# Patient Record
Sex: Male | Born: 1970 | Race: Black or African American | Hispanic: No | Marital: Married | Smoking: Never smoker
Health system: Southern US, Community
[De-identification: ages and names within clinical notes are randomized; demographics above are authoritative.]

## PROBLEM LIST (undated history)

## (undated) DIAGNOSIS — M25559 Pain in unspecified hip: Secondary | ICD-10-CM

## (undated) DIAGNOSIS — Z87442 Personal history of urinary calculi: Secondary | ICD-10-CM

## (undated) DIAGNOSIS — I10 Essential (primary) hypertension: Secondary | ICD-10-CM

## (undated) DIAGNOSIS — L409 Psoriasis, unspecified: Secondary | ICD-10-CM

## (undated) HISTORY — PX: NO PAST SURGERIES: SHX2092

## (undated) HISTORY — DX: Psoriasis, unspecified: L40.9

## (undated) HISTORY — DX: Essential (primary) hypertension: I10

## (undated) HISTORY — DX: Personal history of urinary calculi: Z87.442

---

## 2004-11-10 ENCOUNTER — Ambulatory Visit: Payer: Self-pay | Admitting: Family Medicine

## 2004-11-16 ENCOUNTER — Encounter: Admission: RE | Admit: 2004-11-16 | Discharge: 2004-12-17 | Payer: Self-pay | Admitting: Family Medicine

## 2004-12-29 ENCOUNTER — Ambulatory Visit: Payer: Self-pay | Admitting: Family Medicine

## 2005-01-05 ENCOUNTER — Encounter: Admission: RE | Admit: 2005-01-05 | Discharge: 2005-01-07 | Payer: Self-pay | Admitting: Family Medicine

## 2005-01-06 ENCOUNTER — Emergency Department (HOSPITAL_COMMUNITY): Admission: EM | Admit: 2005-01-06 | Discharge: 2005-01-06 | Payer: Self-pay | Admitting: Emergency Medicine

## 2012-11-30 ENCOUNTER — Other Ambulatory Visit: Payer: Self-pay

## 2012-12-08 ENCOUNTER — Encounter: Payer: Self-pay | Admitting: Nurse Practitioner

## 2012-12-08 ENCOUNTER — Ambulatory Visit (INDEPENDENT_AMBULATORY_CARE_PROVIDER_SITE_OTHER): Payer: Managed Care, Other (non HMO) | Admitting: Nurse Practitioner

## 2012-12-08 VITALS — BP 127/78 | HR 52 | Temp 99.0°F | Ht 71.0 in | Wt 223.0 lb

## 2012-12-08 DIAGNOSIS — L409 Psoriasis, unspecified: Secondary | ICD-10-CM

## 2012-12-08 DIAGNOSIS — L408 Other psoriasis: Secondary | ICD-10-CM

## 2012-12-08 MED ORDER — DOXYCYCLINE HYCLATE 100 MG PO CAPS: 100.0000 mg | ORAL_CAPSULE | Freq: Two times a day (BID) | ORAL | Status: DC

## 2012-12-08 NOTE — Patient Instructions (Signed)
Psoriasis Psoriasis is a common, long-lasting (chronic) inflammation of the skin. It affects both men and women equally, of all ages and all races. Psoriasis cannot be passed from person to person (not contagious). Psoriasis varies from mild to very severe. When severe, it can greatly affect your quality of life. Psoriasis is an inflammatory disorder affecting the skin as well as other organs including the joints (causing an arthritis). With psoriasis, the skin sheds its top layer of cells more rapidly than it does in someone without psoriasis. CAUSES  The cause of psoriasis is largely unknown. Genetics, your immune system, and the environment seem to play a role in causing psoriasis. Factors that can make psoriasis worse include:  Damage or trauma to the skin, such as cuts, scrapes, and sunburn. This damage often causes new areas of psoriasis (lesions).  Winter dryness and lack of sunlight.  Medicines such as lithium, beta-blockers, antimalarial drugs, ACE inhibitors, nonsteroidal anti-inflammatory drugs (ibuprofen, aspirin), and terbinafine. Let your caregiver know if you are taking any of these drugs.  Alcohol. Excessive alcohol use should be avoided if you have psoriasis. Drinking large amounts of alcohol can affect:  How well your psoriasis treatment works.  How safe your psoriasis treatment is.  Smoking. If you smoke, ask your caregiver for help to quit.  Stress.  Bacterial or viral infections.  Arthritis. Arthritis associated with psoriasis (psoriatic arthritis) affects less than 10% of patients with psoriasis. The arthritic intensity does not always match the skin psoriasis intensity. It is important to let your caregiver know if your joints hurt or if they are stiff. SYMPTOMS  The most common form of psoriasis begins with little red bumps that gradually become larger. The bumps begin to form scales that flake off easily. The lower layers of scales stick together. When these scales  are scratched or removed, the underlying skin is tender and bleeds easily. These areas then grow in size and may become large. Psoriasis often creates a rash that looks the same on both sides of the body (symmetrical). It often affects the elbows, knees, groin, genitals, arms, legs, scalp, and nails. Affected nails often have pitting, loosen, thicken, crumble, and are difficult to treat.  "Inverse psoriasis"occurs in the armpits, under breasts, in skin folds, and around the groin, buttocks, and genitals.  "Guttate psoriasis" generally occurs in children and young adults following a recent sore throat (strep throat). It begins with many small, red, scaly spots on the skin. It clears spontaneously in weeks or a few months without treatment. DIAGNOSIS  Psoriasis is diagnosed by physical exam. A tissue sample (biopsy) may also be taken. TREATMENT The treatment of psoriasis depends on your age, health, and living conditions.  Steroid (cortisone) creams, lotions, and ointments may be used. These treatments are associated with thinning of the skin, blood vessels that get larger (dilated), loss of skin pigmentation, and easy bruising. It is important to use these steroids as directed by your caregiver. Only treat the affected areas and not the normal, unaffected skin. People on long-term steroid treatment should wear a medical alert bracelet. Injections may be used in areas that are difficult to treat.  Scalp treatments are available as shampoos, solutions, sprays, foams, and oils. Avoid scratching the scalp and picking at the scales.  Anthralin medicine works well on areas that are difficult to treat. However, it stains clothes and skin and may cause temporary irritation.  Synthetic vitamin D (calcipotriene)can be used on small areas. It is available by prescription. The forms   of synthetic vitamin D available in health food stores do not help with psoriasis.  Coal tarsare available in various strengths  for psoriasis that is difficult to treat. They are one of the longest used treatments for difficult to treat psoriasis. However, they are messy to use.  Light therapy (UV therapy) can be carefully and professionally monitored in a dermatologist's office. Careful sunbathing is helpful for many people as directed by your caregiver. The exposure should be just long enough to cause a mild redness (erythema) of your skin. Avoid sunburn as this may make the condition worse. Sunscreen (SPF of 30 or higher) should be used to protect against sunburn. Cataracts, wrinkles, and skin aging are some of the harmful side effects of light therapy.  If creams (topical medicines) fail, there are several other options for systemic or oral medicines your caregiver can suggest. Psoriasis can sometimes be very difficult to treat. It can come and go. It is necessary to follow up with your caregiver regularly if your psoriasis is difficult to treat. Usually, with persistence you can get a good amount of relief. Maintaining consistent care is important. Do not change caregivers just because you do not see immediate results. It may take several trials to find the right combination of treatment for you. PREVENTING FLARE-UPS  Wear gloves while you wash dishes, while cleaning, and when you are outside in the cold.  If you have radiators, place a bowl of water or damp towel on the radiator. This will help put water back in the air. You can also use a humidifier to keep the air moist. Try to keep the humidity at about 60% in your home.  Apply moisturizer while your skin is still damp from bathing or showering. This traps water in the skin.  Avoid long, hot baths or showers. Keep soap use to a minimum. Soaps dry out the skin and wash away the protective oils. Use a fragrance free, dye free soap.  Drink enough water and fluids to keep your urine clear or pale yellow. Not drinking enough water depletes your skin's water  supply.  Turn off the heat at night and keep it low during the day. Cool air is less drying. SEEK MEDICAL CARE IF:  You have increasing pain in the affected areas.  You have uncontrolled bleeding in the affected areas.  You have increasing redness or warmth in the affected areas.  You start to have pain or stiffness in your joints.  You start feeling depressed about your condition.  You have a fever. Document Released: 07/30/2000 Document Revised: 10/25/2011 Document Reviewed: 01/25/2011 [employer] Patient Information 2013 [employer].  

## 2012-12-08 NOTE — Progress Notes (Signed)
  Subjective:    Patient ID: Stephen Schwartz, male    DOB: Jan 07, 1971, 42 y.o.   MRN: [id number]  HPI- Patient coming in for recheck of psoriasis. Patient has had for 7-8 years mainly just in his scalp. Patient takes Doxycycline BID which helps. Patient hasn't seen derm in several years. Dermatologist was the one that started him on antibiotic. Otherwise doing well today. No other complaints.    Review of Systems  All other systems reviewed and are negative.       Objective:   Physical Exam  Constitutional: He appears well-developed.  Cardiovascular: Normal rate, regular rhythm and normal heart sounds.   Pulmonary/Chest: Effort normal and breath sounds normal.  Skin:  Patchy ares of silvery dry plaque all over top of head/scalp.  BP 127/78  Pulse 52  Temp(Src) 99 F (37.2 C) (Oral)  Ht 5\' 11"  (1.803 m)  Wt 223 lb (101.152 kg)  BMI 31.12 kg/m2         Assessment & Plan:  Psoriasis  Continue doxycycline BID  Salicylic acid shampoo as indictaed  Will do referral to derm if worsens Mary-Margaret Daphine Deutscher, FNP

## 2013-02-28 ENCOUNTER — Ambulatory Visit: Payer: Managed Care, Other (non HMO) | Admitting: Physician Assistant

## 2013-03-01 ENCOUNTER — Encounter: Payer: Self-pay | Admitting: Physician Assistant

## 2013-03-01 ENCOUNTER — Ambulatory Visit (INDEPENDENT_AMBULATORY_CARE_PROVIDER_SITE_OTHER): Payer: Managed Care, Other (non HMO) | Admitting: Physician Assistant

## 2013-03-01 VITALS — BP 136/80 | HR 43 | Temp 97.0°F | Ht 71.0 in | Wt 221.0 lb

## 2013-03-01 DIAGNOSIS — R319 Hematuria, unspecified: Secondary | ICD-10-CM

## 2013-03-01 DIAGNOSIS — Z23 Encounter for immunization: Secondary | ICD-10-CM

## 2013-03-01 LAB — POCT URINALYSIS DIPSTICK
Bilirubin, UA: NEGATIVE
Glucose, UA: NEGATIVE
Spec Grav, UA: 1.02
pH, UA: 5

## 2013-03-01 NOTE — Patient Instructions (Addendum)
Hematuria, Adult Hematuria (blood in your urine) can be caused by a bladder infection (cystitis), kidney infection (pyelonephritis), prostate infection (prostatitis), or kidney stone. Infections will usually respond to antibiotics (medications which kill germs), and a kidney stone will usually pass through your urine without further treatment. If you were put on antibiotics, take all the medicine until gone. You may feel better in a few days, but take all of your medicine or the infection may not respond and become more difficult to treat. If antibiotics were not given, an infection did not cause the blood in the urine. A further work up to find out the reason may be needed. HOME CARE INSTRUCTIONS   Drink lots of fluid, 3 to 4 quarts a day. If you have been diagnosed with an infection, cranberry juice is especially recommended, in addition to large amounts of water.  Avoid caffeine, tea, and carbonated beverages, because they tend to irritate the bladder.  Avoid alcohol as it may irritate the prostate.  Only take over-the-counter or prescription medicines for pain, discomfort, or fever as directed by your caregiver.  If you have been diagnosed with a kidney stone follow your caregivers instructions regarding straining your urine to catch the stone. TO PREVENT FURTHER INFECTIONS:  Empty the bladder often. Avoid holding urine for long periods of time.  After a bowel movement, women should cleanse front to back. Use each tissue only once.  Empty the bladder before and after sexual intercourse if you are a male.  Return to your caregiver if you develop back pain, fever, nausea (feeling sick to your stomach), vomiting, or your symptoms (problems) are not better in 3 days. Return sooner if you are getting worse. If you have been requested to return for further testing make sure to keep your appointments. If an infection is not the cause of blood in your urine, X-rays may be required. Your caregiver  will discuss this with you. SEEK IMMEDIATE MEDICAL CARE IF:   You have a persistent fever over 102 F (38.9 C).  You develop severe vomiting and are unable to keep the medication down.  You develop severe back or abdominal pain despite taking your medications.  You begin passing a large amount of blood or clots in your urine.  You feel extremely weak or faint, or pass out. MAKE SURE YOU:   Understand these instructions.  Will watch your condition.  Will get help right away if you are not doing well or get worse. Document Released: 08/02/2005 Document Revised: 10/25/2011 Document Reviewed: 03/21/2008 [facility] Patient Information 2014 [place], [place]. Tetanus, Diphtheria, Pertussis (Tdap) Vaccine What You Need to Know WHY GET VACCINATED? Tetanus, diphtheria and pertussis can be very serious diseases, even for adolescents and adults. Tdap vaccine can protect [place] from these diseases. TETANUS (Lockjaw) causes painful muscle tightening and stiffness, usually all over the body.  It can lead to tightening of muscles in the head and neck so you can't open your mouth, swallow, or sometimes even breathe. Tetanus kills about 1 out of 5 people who are infected. DIPHTHERIA can cause a thick coating to form in the back of the throat.  It can lead to breathing problems, paralysis, heart failure, and death. PERTUSSIS (Whooping Cough) causes severe coughing spells, which can cause difficulty breathing, vomiting and disturbed sleep.  It can also lead to weight loss, incontinence, and rib fractures. Up to 2 in 100 adolescents and 5 in 100 adults with pertussis are hospitalized or have complications, which could include pneumonia and  death. These diseases are caused by bacteria. Diphtheria and pertussis are spread from person to person through coughing or sneezing. Tetanus enters the body through cuts, scratches, or wounds. Before vaccines, the [place] saw as many as 200,000 cases a year of  diphtheria and pertussis, and hundreds of cases of tetanus. Since vaccination began, tetanus and diphtheria have dropped by about 99% and pertussis by about 80%. TDAP VACCINE Tdap vaccine can protect adolescents and adults from tetanus, diphtheria, and pertussis. One dose of Tdap is routinely given at age 72 or 66. People who did not get Tdap at that age should get it as soon as possible. Tdap is especially important for health care professionals and anyone having close contact with a baby younger than 12 months. Pregnant women should get a dose of Tdap during every pregnancy, to protect the newborn from pertussis. Infants are most at risk for severe, life-threatening complications from pertussis. A similar vaccine, called Td, protects from tetanus and diphtheria, but not pertussis. A Td booster should be given every 10 years. Tdap may be given as one of these boosters if you have not already gotten a dose. Tdap may also be given after a severe cut or burn to prevent tetanus infection. Your doctor can give you more information. Tdap may safely be given at the same time as other vaccines. SOME PEOPLE SHOULD NOT GET THIS VACCINE  If you ever had a life-threatening allergic reaction after a dose of any tetanus, diphtheria, or pertussis containing vaccine, OR if you have a severe allergy to any part of this vaccine, you should not get Tdap. Tell your doctor if you have any severe allergies.  If you had a coma, or long or multiple seizures within 7 days after a childhood dose of DTP or DTaP, you should not get Tdap, unless a cause other than the vaccine was found. You can still get Td.  Talk to your doctor if you:  have epilepsy or another nervous system problem,  had severe pain or swelling after any vaccine containing diphtheria, tetanus or pertussis,  ever had Guillain-Barr Syndrome (GBS),  aren't feeling well on the day the shot is scheduled. RISKS OF A VACCINE REACTION With any medicine,  including vaccines, there is a chance of side effects. These are usually mild and go away on their own, but serious reactions are also possible. Brief fainting spells can follow a vaccination, leading to injuries from falling. Sitting or lying down for about 15 minutes can help prevent these. Tell your doctor if you feel dizzy or light-headed, or have vision changes or ringing in the ears. Mild problems following Tdap (Did not interfere with activities)  Pain where the shot was given (about 3 in 4 adolescents or 2 in 3 adults)  Redness or swelling where the shot was given (about 1 person in 5)  Mild fever of at least 100.66F (up to about 1 in 25 adolescents or 1 in 100 adults)  Headache (about 3 or 4 people in 10)  Tiredness (about 1 person in 3 or 4)  Nausea, vomiting, diarrhea, stomach ache (up to 1 in 4 adolescents or 1 in 10 adults)  Chills, body aches, sore joints, rash, swollen glands (uncommon) Moderate problems following Tdap (Interfered with activities, but did not require medical attention)  Pain where the shot was given (about 1 in 5 adolescents or 1 in 100 adults)  Redness or swelling where the shot was given (up to about 1 in 16 adolescents or  1 in 25 adults)  Fever over 102F (about 1 in 100 adolescents or 1 in 250 adults)  Headache (about 3 in 20 adolescents or 1 in 10 adults)  Nausea, vomiting, diarrhea, stomach ache (up to 1 or 3 people in 100)  Swelling of the entire arm where the shot was given (up to about 3 in 100). Severe problems following Tdap (Unable to perform usual activities, required medical attention)  Swelling, severe pain, bleeding and redness in the arm where the shot was given (rare). A severe allergic reaction could occur after any vaccine (estimated less than 1 in a million doses). WHAT IF THERE IS A SERIOUS REACTION? What should I look for?  Look for anything that concerns you, such as signs of a severe allergic reaction, very high fever, or  behavior changes. Signs of a severe allergic reaction can include hives, swelling of the face and throat, difficulty breathing, a fast heartbeat, dizziness, and weakness. These would start a few minutes to a few hours after the vaccination. What should I do?  If you think it is a severe allergic reaction or other emergency that can't wait, call 9-1-1 or get the person to the nearest hospital. Otherwise, call your doctor.  Afterward, the reaction should be reported to the "Vaccine Adverse Event Reporting System" (VAERS). Your doctor might file this report, or you can do it yourself through the VAERS web site at www.vaers.LAgents.no, or by calling 1-281-466-4694. VAERS is only for reporting reactions. They do not give medical advice.  THE NATIONAL VACCINE INJURY COMPENSATION PROGRAM The National Vaccine Injury Compensation Program (VICP) is a federal program that was created to compensate people who may have been injured by certain vaccines. Persons who believe they may have been injured by a vaccine can learn about the program and about filing a claim by calling 1-(272) 181-2340 or visiting the VICP website at SpiritualWord.at. HOW CAN I LEARN MORE?  Ask your doctor.  Call your local or state health department.  Contact the Centers for Disease Control and Prevention (CDC):  Call 726 677 9200 or visit CDC's website at PicCapture.uy. CDC Tdap Vaccine VIS (12/23/11) Document Released: 02/01/2012 Document Revised: 04/26/2012 Document Reviewed: 02/01/2012 [employer] Patient Information 2014 [place], [place].

## 2013-03-01 NOTE — Progress Notes (Signed)
Subjective:     Patient ID: Stephen Schwartz, male   DOB: 12-28-1970, 42 y.o.   MRN: [id number]  HPI Pt here for f/u of DOT PE He was informed of hematuria results and told to f/u with his MD Pt with a hx of renal stone many years ago but never told this may cause hematuria He was told at that time he had another small stone Denies any back pain, abd pain, or urinary sx  Review of Systems  All other systems reviewed and are negative.       Objective:   Physical Exam  Nursing note and vitals reviewed. Abd- soft, NT/ND, no masses/HSM No CVAT Urine- + mod blood on dip     Assessment:     Hematuria    Plan:     Refer pt for CT abd  Also given hx will refer to Urol Pt to hydrate Reviewed nl course with pt Updated tetanus today F/U prn

## 2013-03-09 ENCOUNTER — Ambulatory Visit (HOSPITAL_COMMUNITY)
Admission: RE | Admit: 2013-03-09 | Discharge: 2013-03-09 | Disposition: A | Discharge status: Home / Self Care | Payer: Managed Care, Other (non HMO) | Source: Ambulatory Visit | Attending: Physician Assistant | Admitting: Physician Assistant

## 2013-03-09 DIAGNOSIS — N209 Urinary calculus, unspecified: Secondary | ICD-10-CM | POA: Insufficient documentation

## 2013-03-09 DIAGNOSIS — Q619 Cystic kidney disease, unspecified: Secondary | ICD-10-CM | POA: Insufficient documentation

## 2013-03-09 DIAGNOSIS — R319 Hematuria, unspecified: Secondary | ICD-10-CM | POA: Insufficient documentation

## 2013-04-03 ENCOUNTER — Ambulatory Visit (INDEPENDENT_AMBULATORY_CARE_PROVIDER_SITE_OTHER): Payer: Managed Care, Other (non HMO) | Admitting: Family Medicine

## 2013-04-03 ENCOUNTER — Encounter: Payer: Self-pay | Admitting: Family Medicine

## 2013-04-03 VITALS — BP 137/90 | HR 44 | Temp 96.7°F | Ht 71.0 in | Wt 222.5 lb

## 2013-04-03 DIAGNOSIS — I10 Essential (primary) hypertension: Secondary | ICD-10-CM

## 2013-04-03 MED ORDER — LISINOPRIL 10 MG PO TABS: 10.0000 mg | ORAL_TABLET | Freq: Every day | ORAL | Status: DC

## 2013-04-03 NOTE — Progress Notes (Signed)
  Subjective:    Patient ID: Stephen Schwartz, male    DOB: Apr 24, 1971, 42 y.o.   MRN: [id number]  HPI This 42 y.o. male presents for evaluation of elevated bilirubin and elevated PSA.  He  Is seeing urology for hematuria and has had recent labs.  He brings in the lab results From urology and it shows PSA of 2.56 and bili of 1.4.  The bili is mildly elevated. He denies any hx of liver disease, abdominal pain, jaundice, or GB problems. He  Has elevated bp readings which are running in the 150's/90's.  He denies any nocturia Or testicular pain or dysuria.  He denies any pain with bm's.  .   Review of Systems    No chest pain, SOB, HA, dizziness, vision change, N/V, diarrhea, constipation, dysuria, urinary urgency or frequency, myalgias, arthralgias or rash.  Objective:   Physical Exam Vital signs noted  Well developed well nourished male.  HEENT - Head atraumatic Normocephalic                Eyes - PERRLA, Conjuctiva - clear Sclera- Clear EOMI                Ears - EAC's Wnl TM's Wnl Gross Hearing WNL                Nose - Nares patent                 Throat - oropharanx wnl Respiratory - Lungs CTA bilateral Cardiac - RRR S1 and S2 without murmur GI - Abdomen soft Nontender and bowel sounds active x 4 Rectal - Declines Extremities - No edema. Neuro - Grossly intact.       Assessment & Plan:  Essential hypertension, benign - Plan: lisinopril (PRINIVIL,ZESTRIL) 10 MG tablet Discussed lifestyle modifications, weight loss, exercise, DASH diet, and follow up in 6 months.  Hyperbilirubinemia - This is mild and recommend repeat cmp in 6 months.  Elevated PSA - Recommend repeat PSA in 6 months.  Checked his chart and he has no other baseline. Discussed signs and symptoms of prostatitis and recommend he follow up for DRE and prostated exam  And repeat PSA in 6 months.  If develops nocturia or prostatitis sx's please follow up.  Nephrolithiasis - He has 2 kidney stones in his left  kidney.  He is reassured. He has been  Having kidney stones a couple years ago and has no sx's today.  Hematuria - Follow up with Urology.  Follow up in 6 months

## 2013-04-03 NOTE — Patient Instructions (Signed)

## 2013-08-26 ENCOUNTER — Other Ambulatory Visit: Payer: Self-pay | Admitting: Nurse Practitioner

## 2013-08-28 NOTE — Telephone Encounter (Signed)
Last seen 04/03/13  B [place]

## 2013-10-04 ENCOUNTER — Ambulatory Visit: Payer: Managed Care, Other (non HMO) | Admitting: Family Medicine

## 2014-09-12 ENCOUNTER — Encounter: Payer: Self-pay | Admitting: Family Medicine

## 2014-09-12 ENCOUNTER — Ambulatory Visit (INDEPENDENT_AMBULATORY_CARE_PROVIDER_SITE_OTHER): Payer: Managed Care, Other (non HMO) | Admitting: Family Medicine

## 2014-09-12 ENCOUNTER — Encounter (INDEPENDENT_AMBULATORY_CARE_PROVIDER_SITE_OTHER): Payer: Self-pay

## 2014-09-12 VITALS — BP 134/84 | HR 79 | Temp 97.8°F | Ht 71.0 in | Wt 227.6 lb

## 2014-09-12 DIAGNOSIS — L409 Psoriasis, unspecified: Secondary | ICD-10-CM

## 2014-09-12 DIAGNOSIS — R809 Proteinuria, unspecified: Secondary | ICD-10-CM

## 2014-09-12 LAB — POCT UA - MICROSCOPIC ONLY
BACTERIA, U MICROSCOPIC: NEGATIVE
CASTS, UR, LPF, POC: NEGATIVE
CRYSTALS, UR, HPF, POC: NEGATIVE
MUCUS UA: NEGATIVE
RBC, URINE, MICROSCOPIC: NEGATIVE
WBC, Ur, HPF, POC: NEGATIVE
Yeast, UA: NEGATIVE

## 2014-09-12 LAB — POCT URINALYSIS DIPSTICK
Bilirubin, UA: NEGATIVE
Blood, UA: NEGATIVE
Glucose, UA: NEGATIVE
Ketones, UA: NEGATIVE
Leukocytes, UA: NEGATIVE
NITRITE UA: NEGATIVE
PH UA: 6.5
Protein, UA: NEGATIVE
Spec Grav, UA: 1.015
Urobilinogen, UA: NEGATIVE

## 2014-09-12 MED ORDER — DOXYCYCLINE HYCLATE 100 MG PO CAPS: 100.0000 mg | ORAL_CAPSULE | Freq: Two times a day (BID) | ORAL | Status: DC

## 2014-09-12 MED ORDER — SALICYLIC ACID 6 % EX SHAM: MEDICATED_SHAMPOO | CUTANEOUS | Status: DC

## 2014-09-12 NOTE — Progress Notes (Signed)
Subjective:    Patient ID: Stephen Schwartz, male    DOB: 16-Jan-1971, 44 y.o.   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 He denies any injury. He says he just didn't feel good the night before the DOT exam. He's had no dysuria. No flank pain and no abdominal pain. He denies changes in his urinary flow. He's noted no blood. He is not a diabetic and has not noted polyuria or polydipsia. There has been no frequency or urgency.  His psoriasis is stable on the current medicines. They work fairly well but it is chronic and does flareup at times. He would like to have those medicines renewed. Location and severity unchanged from previous evaluations.          No Known Allergies  Outpatient Encounter Prescriptions as of 09/12/2014  Medication Sig  . doxycycline (VIBRAMYCIN) 100 MG capsule Take 1 capsule (100 mg total) by mouth 2 (two) times daily.  . Salicylic Acid 6 % SHAM USE AS DIRECTED  . [DISCONTINUED] lisinopril (PRINIVIL,ZESTRIL) 10 MG tablet Take 1 tablet (10 mg total) by mouth daily. (Patient not taking: Reported on 09/12/2014)    Past Medical History  Diagnosis Date  . Psoriasis   . Psoriasis   . History of nephrolithiasis     No past surgical history on file.  History   Social History  . Marital Status: Married    Spouse Name: N/A    Number of Children: N/A  . Years of Education: N/A   Occupational History  . Not on file.   Social History Main Topics  . Smoking status: Never Smoker   . Smokeless tobacco: Never Used  . Alcohol Use: No  . Drug Use: No  . Sexual Activity: Not on file   Other Topics Concern  . Not on file   Social History Narrative    Review of Systems  Constitutional: Negative for fever, chills, diaphoresis and unexpected weight change.  HENT: Negative for congestion, hearing loss, rhinorrhea, sore throat and trouble swallowing.     Respiratory: Negative for cough, chest tightness, shortness of breath and wheezing.   Gastrointestinal: Negative for nausea, vomiting, abdominal pain, diarrhea, constipation and abdominal distention.  Endocrine: Negative for cold intolerance and heat intolerance.  Genitourinary: Negative for dysuria, hematuria and flank pain.  Musculoskeletal: Negative for joint swelling and arthralgias.  Skin: Negative for rash.  Neurological: Negative for dizziness and headaches.  Psychiatric/Behavioral: Negative for dysphoric mood, decreased concentration and agitation. The patient is not nervous/anxious.        Objective:   Physical Exam  Constitutional: He is oriented to person, place, and time. He appears well-developed and well-nourished. No distress.  HENT:  Head: Normocephalic and atraumatic.  Eyes: Conjunctivae are normal. Pupils are equal, round, and reactive to light.  Neck: Normal range of motion. Neck supple.  Cardiovascular: Normal rate, regular rhythm and normal heart sounds.   No murmur heard. Pulmonary/Chest: Effort normal and breath sounds normal. No respiratory distress. He has no wheezes. He has no rales.  Abdominal: Soft. Bowel sounds are normal. He exhibits no distension. There is no tenderness.  Musculoskeletal: He exhibits no tenderness.  Neurological: He is alert and oriented to person, place, and time. He has normal reflexes.  Skin: Skin is warm and dry.  Psychiatric: He has a normal mood and affect. His behavior is normal. Judgment and thought content  normal.   BP 134/84 mmHg  Pulse 79  Temp(Src) 97.8 F (36.6 C) (Oral)  Ht '5\' 11"'  (1.803 m)  Wt 227 lb 9.6 oz (103.239 kg)  BMI 31.76 kg/m2        Assessment & Plan:   1. Proteinuria   2. Psoriasis     Meds ordered this encounter  Medications  . DISCONTD: doxycycline (VIBRAMYCIN) 100 MG capsule    Sig: Take 1 capsule (100 mg total) by mouth 2 (two) times daily.    Dispense:  60 capsule    Refill:  11  .  DISCONTD: Salicylic Acid 6 % SHAM    Sig: USE AS DIRECTED    Dispense:  177 mL    Refill:  11  . doxycycline (VIBRAMYCIN) 100 MG capsule    Sig: Take 1 capsule (100 mg total) by mouth 2 (two) times daily.    Dispense:  60 capsule    Refill:  11  . Salicylic Acid 6 % SHAM    Sig: USE AS DIRECTED    Dispense:  177 mL    Refill:  11    Orders Placed This Encounter  Procedures  . BMP8+EGFR  . POCT UA - Microscopic Only  . POCT urinalysis dipstick    Claretta Fraise, MD

## 2014-09-13 LAB — BMP8+EGFR
BUN / CREAT RATIO: 7 — AB (ref 9–20)
BUN: 8 mg/dL (ref 6–24)
CALCIUM: 8.6 mg/dL — AB (ref 8.7–10.2)
CHLORIDE: 99 mmol/L (ref 97–108)
CO2: 25 mmol/L (ref 18–29)
CREATININE: 1.19 mg/dL (ref 0.76–1.27)
GFR calc Af Amer: 86 mL/min/{1.73_m2} (ref >59)
GFR calc non Af Amer: 74 mL/min/{1.73_m2} (ref >59)
Glucose: 84 mg/dL (ref 65–99)
Potassium: 4.4 mmol/L (ref 3.5–5.2)
SODIUM: 139 mmol/L (ref 134–144)

## 2015-02-12 ENCOUNTER — Encounter: Payer: Self-pay | Admitting: Physician Assistant

## 2015-02-12 ENCOUNTER — Ambulatory Visit (INDEPENDENT_AMBULATORY_CARE_PROVIDER_SITE_OTHER): Payer: Managed Care, Other (non HMO) | Admitting: Physician Assistant

## 2015-02-12 VITALS — BP 145/86 | HR 62 | Temp 97.8°F | Ht 71.0 in | Wt 222.4 lb

## 2015-02-12 DIAGNOSIS — M722 Plantar fascial fibromatosis: Secondary | ICD-10-CM | POA: Diagnosis not present

## 2015-02-12 NOTE — Progress Notes (Signed)
Subjective:     Patient ID: Stephen Schwartz, male   DOB: 03/27/1971, 44 y.o.   MRN: [id number]  HPI L heel pain for several weeks Denies any injury Sx worse after sitting or when 1st waking These sx improve with walking  Review of Systems     Objective:   Physical Exam No ecchy/edema to the L foot Good pulses to the foot Sensory intact + TTP at insertion of plantar fasc + sx with stressing No TTP of the Achilles    Assessment:     Plantar fasc    Plan:     Heat/Ice OTC NSAIDS Heel cups Gentle stretching with a ball Nl course reviewed F/U prn

## 2015-02-12 NOTE — Patient Instructions (Signed)
Plantar Fasciitis  Plantar fasciitis is a common condition that causes foot pain. It is soreness (inflammation) of the band of tough fibrous tissue on the bottom of the foot that runs from the heel bone (calcaneus) to the ball of the foot. The cause of this soreness may be from excessive standing, poor fitting shoes, running on hard surfaces, being overweight, having an abnormal walk, or overuse (this is common in runners) of the painful foot or feet. It is also common in aerobic exercise dancers and ballet dancers.  SYMPTOMS   Most people with plantar fasciitis complain of:   Severe pain in the morning on the bottom of their foot especially when taking the first steps out of bed. This pain recedes after a few minutes of walking.   Severe pain is experienced also during walking following a long period of inactivity.   Pain is worse when walking barefoot or up stairs  DIAGNOSIS    Your caregiver will diagnose this condition by examining and feeling your foot.   Special tests such as X-rays of your foot, are usually not needed.  PREVENTION    Consult a sports medicine professional before beginning a new exercise program.   Walking programs offer a good workout. With walking there is a lower chance of overuse injuries common to runners. There is less impact and less jarring of the joints.   Begin all new exercise programs slowly. If problems or pain develop, decrease the amount of time or distance until you are at a comfortable level.   Wear good shoes and replace them regularly.   Stretch your foot and the heel cords at the back of the ankle (Achilles tendon) both before and after exercise.   Run or exercise on even surfaces that are not hard. For example, asphalt is better than pavement.   Do not run barefoot on hard surfaces.   If using a treadmill, vary the incline.   Do not continue to workout if you have foot or joint problems. Seek professional help if they do not improve.  HOME CARE INSTRUCTIONS     Avoid activities that cause you pain until you recover.   Use ice or cold packs on the problem or painful areas after working out.   Only take over-the-counter or prescription medicines for pain, discomfort, or fever as directed by your caregiver.   Soft shoe inserts or athletic shoes with air or gel sole cushions may be helpful.   If problems continue or become more severe, consult a sports medicine caregiver or your own health care provider. Cortisone is a potent anti-inflammatory medication that may be injected into the painful area. You can discuss this treatment with your caregiver.  MAKE SURE YOU:    Understand these instructions.   Will watch your condition.   Will get help right away if you are not doing well or get worse.  Document Released: 04/27/2001 Document Revised: 10/25/2011 Document Reviewed: 06/26/2008  ExitCare Patient Information 2015 [employer]. This information is not intended to replace advice given to you by your health care provider. Make sure you discuss any questions you have with your health care provider.

## 2016-01-19 ENCOUNTER — Encounter: Payer: Self-pay | Admitting: Physician Assistant

## 2016-01-19 ENCOUNTER — Ambulatory Visit (INDEPENDENT_AMBULATORY_CARE_PROVIDER_SITE_OTHER): Payer: Managed Care, Other (non HMO) | Admitting: Physician Assistant

## 2016-01-19 VITALS — BP 150/97 | HR 79 | Temp 97.0°F | Ht 71.0 in | Wt 222.0 lb

## 2016-01-19 DIAGNOSIS — M25552 Pain in left hip: Secondary | ICD-10-CM | POA: Diagnosis not present

## 2016-01-19 MED ORDER — MELOXICAM 15 MG PO TABS: 15.0000 mg | ORAL_TABLET | Freq: Every day | ORAL | Status: DC

## 2016-01-19 NOTE — Progress Notes (Signed)
Subjective:     Patient ID: Stephen Schwartz, male   DOB: 05-15-1971, 45 y.o.   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 Left hip pain        Plan:     Hold squatting for now Heat/Ice Mobic 15mg  #14 Hold other OTC NSAIDS while taking Ease back in to activities F/U prn

## 2016-01-19 NOTE — Patient Instructions (Signed)

## 2016-04-01 ENCOUNTER — Encounter: Payer: Self-pay | Admitting: Pediatrics

## 2016-04-01 ENCOUNTER — Ambulatory Visit (INDEPENDENT_AMBULATORY_CARE_PROVIDER_SITE_OTHER): Payer: Managed Care, Other (non HMO)

## 2016-04-01 ENCOUNTER — Ambulatory Visit (INDEPENDENT_AMBULATORY_CARE_PROVIDER_SITE_OTHER): Payer: Managed Care, Other (non HMO) | Admitting: Pediatrics

## 2016-04-01 VITALS — BP 154/97 | HR 51 | Temp 97.3°F | Ht 71.0 in | Wt 221.0 lb

## 2016-04-01 DIAGNOSIS — R030 Elevated blood-pressure reading, without diagnosis of hypertension: Secondary | ICD-10-CM

## 2016-04-01 DIAGNOSIS — M25552 Pain in left hip: Secondary | ICD-10-CM | POA: Diagnosis not present

## 2016-04-01 DIAGNOSIS — I10 Essential (primary) hypertension: Secondary | ICD-10-CM | POA: Diagnosis not present

## 2016-04-01 DIAGNOSIS — IMO0001 Reserved for inherently not codable concepts without codable children: Secondary | ICD-10-CM

## 2016-04-01 MED ORDER — LISINOPRIL 10 MG PO TABS: 10.0000 mg | ORAL_TABLET | Freq: Every day | ORAL | 3 refills | Status: DC

## 2016-04-01 MED ORDER — NAPROXEN 500 MG PO TABS: 500.0000 mg | ORAL_TABLET | Freq: Two times a day (BID) | ORAL | 1 refills | Status: DC | PRN

## 2016-04-01 NOTE — Patient Instructions (Addendum)
Goal blood pressure: Less than 140 on top Less than 90 on bottom  Naproxen twice a day as needed for hip pain Take with food

## 2016-04-01 NOTE — Progress Notes (Signed)
    Subjective:    Patient ID: Stephen Schwartz, male    DOB: 1971-08-09, 45 y.o.   MRN: [id number]  CC: Hip Pain (Left- 2 month)   HPI: [name] is a 45 y.o. 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 Interim medical history since our last visit reviewed. Allergies and medications reviewed and updated.  History  Smoking Status  . Never Smoker  Smokeless Tobacco  . Never Used    ROS: Per HPI      Objective:    BP (!) 154/97   Pulse (!) 51   Temp 97.3 F (36.3 C) (Oral)   Ht '5\' 11"'$  (1.803 m)   Wt 221 lb (100.2 kg)   BMI 30.82 kg/m   Wt Readings from Last 3 Encounters:  04/01/16 221 lb (100.2 kg)  01/19/16 222 lb (100.7 kg)  02/12/15 222 lb 6.4 oz (100.9 kg)     Gen: NAD, alert, cooperative with exam, NCAT EYES: EOMI, no conjunctival injection, or no icterus CV: NRRR, normal S1/S2, no murmur, distal pulses 2+ b/l Resp: CTABL, no wheezes, normal WOB Abd: +BS, soft, NTND. Ext: No edema, warm Neuro: Alert and oriented, strength equal b/l UE and LE, coordination grossly normal MSK: TTP over L trochanter bursa Pain at end point of L hip external rotation. Normal ROM B/l with hip int and ext rotation     Assessment & Plan:  Mustafa was seen today for hip pain.  Diagnoses and all orders for this visit:  Hip pain, left Xray with narrowed joint space L hip OA likely contributing NSAIDs, rest -     DG HIP UNILAT W OR W/O PELVIS 2-3 VIEWS LEFT; Future -     naproxen (NAPROSYN) 500 MG tablet; Take 1 tablet (500 mg  total) by mouth 2 (two) times daily as needed.  Elevated blood pressure  Essential hypertension Regularly elevated BP Start below meds F/u 4 weeks for repeat labs -     lisinopril (PRINIVIL,ZESTRIL) 10 MG tablet; Take 1 tablet (10 mg total) by mouth daily. -     BMP8+EGFR  Follow up plan: Return in about 4 weeks (around 04/29/2016) for blood pressure f/u.  Assunta Found, MD [facility] 04/01/2016, 10:51 AM

## 2016-04-02 LAB — BMP8+EGFR
BUN / CREAT RATIO: 10 (ref 9–20)
BUN: 12 mg/dL (ref 6–24)
CHLORIDE: 101 mmol/L (ref 96–106)
CO2: 23 mmol/L (ref 18–29)
Calcium: 9.1 mg/dL (ref 8.7–10.2)
Creatinine, Ser: 1.24 mg/dL (ref 0.76–1.27)
GFR calc non Af Amer: 70 mL/min/{1.73_m2} (ref >59)
GFR, EST AFRICAN AMERICAN: 81 mL/min/{1.73_m2} (ref >59)
GLUCOSE: 83 mg/dL (ref 65–99)
POTASSIUM: 4.8 mmol/L (ref 3.5–5.2)
Sodium: 142 mmol/L (ref 134–144)

## 2016-06-18 ENCOUNTER — Telehealth: Payer: Self-pay | Admitting: Family Medicine

## 2016-06-18 ENCOUNTER — Ambulatory Visit: Payer: Managed Care, Other (non HMO) | Admitting: Pediatrics

## 2016-06-18 DIAGNOSIS — M25552 Pain in left hip: Secondary | ICD-10-CM

## 2016-06-18 NOTE — Telephone Encounter (Signed)
Patient aware.

## 2016-06-18 NOTE — Telephone Encounter (Signed)
Referral to ortho placed for ongoing L hip pain

## 2016-06-23 ENCOUNTER — Ambulatory Visit: Payer: Managed Care, Other (non HMO) | Admitting: Pediatrics

## 2016-07-27 ENCOUNTER — Ambulatory Visit (INDEPENDENT_AMBULATORY_CARE_PROVIDER_SITE_OTHER): Payer: Self-pay | Admitting: Family Medicine

## 2016-07-27 ENCOUNTER — Encounter: Payer: Self-pay | Admitting: Family Medicine

## 2016-07-27 DIAGNOSIS — Z024 Encounter for examination for driving license: Secondary | ICD-10-CM | POA: Insufficient documentation

## 2016-07-27 DIAGNOSIS — Z029 Encounter for administrative examinations, unspecified: Secondary | ICD-10-CM

## 2016-07-27 NOTE — Progress Notes (Signed)
   Subjective:  Patient ID: Stephen Schwartz, male    DOB: 1970/08/17  Age: 45 y.o. MRN: [id number]  CC: No chief complaint on file.   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   He has no past surgical history on file.   His family history is not on file.He reports that he has never smoked. He has never used smokeless tobacco. He reports that he does not drink alcohol or use drugs.    ROS Review of Systems  Constitutional: Negative for chills, diaphoresis, fever and unexpected weight change.  HENT: Negative for congestion, hearing loss, rhinorrhea and sore throat.   Eyes: Negative for visual disturbance.  Respiratory: Negative for cough and shortness of breath.   Cardiovascular: Negative for chest pain.  Gastrointestinal: Negative for abdominal pain, constipation and diarrhea.  Genitourinary: Negative for dysuria and flank pain.  Musculoskeletal: Negative for arthralgias and joint swelling.  Skin: Negative for rash.  Neurological: Negative for dizziness and headaches.  Psychiatric/Behavioral: Negative for dysphoric mood and sleep disturbance.    Objective:  There were no vitals taken for this visit.  BP Readings from Last 3 Encounters:  04/01/16 (!) 154/97  01/19/16 (!) 150/97  02/12/15 (!) 145/86    Wt Readings from Last 3 Encounters:  04/01/16 221 lb (100.2 kg)  01/19/16 222 lb (100.7 kg)  02/12/15 222 lb 6.4 oz (100.9 kg)     Physical Exam  Constitutional: He is oriented to person, place, and time. He appears well-developed and well-nourished. No distress.  HENT:  Head: Normocephalic and atraumatic.  Right Ear: External ear normal.  Left Ear: External ear normal.  Nose: Nose normal.  Mouth/Throat: Oropharynx is clear and moist.  Eyes: Conjunctivae and EOM are normal. Pupils are equal, round, and reactive to light.  Neck: Normal range of motion. Neck supple. No  thyromegaly present.  Cardiovascular: Normal rate, regular rhythm and normal heart sounds.   No murmur heard. Pulmonary/Chest: Effort normal and breath sounds normal. No respiratory distress. He has no wheezes. He has no rales.  Abdominal: Soft. Bowel sounds are normal. He exhibits no distension. There is no tenderness.  Lymphadenopathy:    He has no cervical adenopathy.  Neurological: He is alert and oriented to person, place, and time. He has normal reflexes.  Skin: Skin is warm and dry.  Psychiatric: He has a normal mood and affect. His behavior is normal. Judgment and thought content normal.     Lab Results  Component Value Date   GLUCOSE 83 04/01/2016   NA 142 04/01/2016   K 4.8 04/01/2016   CL 101 04/01/2016   CREATININE 1.24 04/01/2016   BUN 12 04/01/2016   CO2 23 04/01/2016        Assessment & Plan:   Diagnoses and all orders for this visit:  Encounter for Department of Transportation (DOT) examination for driving license renewal    I am having Mr. Phelan maintain his doxycycline, Salicylic Acid, naproxen, and lisinopril.  No orders of the defined types were placed in this encounter.    Follow-up: Return in about 1 year (around 07/27/2017).  Claretta Fraise, M.D.

## 2016-08-17 ENCOUNTER — Encounter (HOSPITAL_COMMUNITY): Payer: Self-pay | Admitting: Emergency Medicine

## 2016-08-17 ENCOUNTER — Emergency Department (HOSPITAL_COMMUNITY)
Admission: EM | Admit: 2016-08-17 | Discharge: 2016-08-17 | Disposition: A | Discharge status: Home / Self Care | Payer: Managed Care, Other (non HMO) | Attending: Dermatology | Admitting: Dermatology

## 2016-08-17 DIAGNOSIS — R1030 Lower abdominal pain, unspecified: Secondary | ICD-10-CM | POA: Insufficient documentation

## 2016-08-17 DIAGNOSIS — Z79899 Other long term (current) drug therapy: Secondary | ICD-10-CM | POA: Insufficient documentation

## 2016-08-17 DIAGNOSIS — Z5321 Procedure and treatment not carried out due to patient leaving prior to being seen by health care provider: Secondary | ICD-10-CM | POA: Insufficient documentation

## 2016-08-17 NOTE — ED Notes (Signed)
Lab attempted to obtain blood sample x3. Pt not found in waiting room.

## 2016-08-17 NOTE — ED Triage Notes (Signed)
Pt reports intermittent lower abd pain. Pt denies urinary frequency,dysuria,fever, n/v/d. LBM this am.

## 2016-08-17 NOTE — ED Notes (Signed)
Pt called for lab, pt not found in waiting room or restroom.

## 2017-03-01 ENCOUNTER — Encounter: Payer: Self-pay | Admitting: Family Medicine

## 2017-03-01 ENCOUNTER — Ambulatory Visit (INDEPENDENT_AMBULATORY_CARE_PROVIDER_SITE_OTHER): Payer: BLUE CROSS/BLUE SHIELD | Admitting: Family Medicine

## 2017-03-01 VITALS — BP 158/98 | HR 51 | Temp 99.5°F | Ht 71.0 in | Wt 233.0 lb

## 2017-03-01 DIAGNOSIS — N410 Acute prostatitis: Secondary | ICD-10-CM

## 2017-03-01 DIAGNOSIS — R600 Localized edema: Secondary | ICD-10-CM

## 2017-03-01 DIAGNOSIS — I10 Essential (primary) hypertension: Secondary | ICD-10-CM | POA: Diagnosis not present

## 2017-03-01 LAB — URINALYSIS, COMPLETE
BILIRUBIN UA: NEGATIVE
Glucose, UA: NEGATIVE
Ketones, UA: NEGATIVE
LEUKOCYTES UA: NEGATIVE
Nitrite, UA: NEGATIVE
PH UA: 6 (ref 5.0–7.5)
PROTEIN UA: NEGATIVE
SPEC GRAV UA: 1.02 (ref 1.005–1.030)
Urobilinogen, Ur: 0.2 mg/dL (ref 0.2–1.0)

## 2017-03-01 LAB — MICROSCOPIC EXAMINATION
Bacteria, UA: NONE SEEN
EPITHELIAL CELLS (NON RENAL): NONE SEEN /HPF (ref 0–10)
RBC, UA: NONE SEEN /hpf (ref 0–?)
RENAL EPITHEL UA: NONE SEEN /HPF
WBC, UA: NONE SEEN /hpf (ref 0–?)

## 2017-03-01 NOTE — Patient Instructions (Addendum)
Discontinue creatine. Instead, use an L-arginine supplement and a protein supplement with BCAs (branch chain amino acids) Take doxycycline on an empty stomach twice a day for two weeks. Consider emphasizing definiition instead of bulk with your weight training. This will help your blood pressure. (Use higher sets & reps with lower weight) Use DASH:  DASH Eating Plan DASH stands for "Dietary Approaches to Stop Hypertension." The DASH eating plan is a healthy eating plan that has been shown to reduce high blood pressure (hypertension). It may also reduce your risk for type 2 diabetes, heart disease, and stroke. The DASH eating plan may also help with weight loss. What are tips for following this plan? General guidelines  Avoid eating more than 2,300 mg (milligrams) of salt (sodium) a day. If you have hypertension, you may need to reduce your sodium intake to 1,500 mg a day.  Limit alcohol intake to no more than 1 drink a day for nonpregnant women and 2 drinks a day for men. One drink equals 12 oz of beer, 5 oz of wine, or 1 oz of hard liquor.  Work with your health care provider to maintain a healthy body weight or to lose weight. Ask what an ideal weight is for you.  Get at least 30 minutes of exercise that causes your heart to beat faster (aerobic exercise) most days of the week. Activities may include walking, swimming, or biking.  Work with your health care provider or diet and nutrition specialist (dietitian) to adjust your eating plan to your individual calorie needs. Reading food labels  Check food labels for the amount of sodium per serving. Choose foods with less than 5 percent of the Daily Value of sodium. Generally, foods with less than 300 mg of sodium per serving fit into this eating plan.  To find whole grains, look for the word "whole" as the first word in the ingredient list. Shopping  Buy products labeled as "low-sodium" or "no salt added."  Buy fresh foods. Avoid canned  foods and premade or frozen meals. Cooking  Avoid adding salt when cooking. Use salt-free seasonings or herbs instead of table salt or sea salt. Check with your health care provider or pharmacist before using salt substitutes.  Do not fry foods. Cook foods using healthy methods such as baking, boiling, grilling, and broiling instead.  Cook with heart-healthy oils, such as olive, canola, soybean, or sunflower oil. Meal planning   Eat a balanced diet that includes: ? 5 or more servings of fruits and vegetables each day. At each meal, try to fill half of your plate with fruits and vegetables. ? Up to 6-8 servings of whole grains each day. ? Less than 6 oz of lean meat, poultry, or fish each day. A 3-oz serving of meat is about the same size as a deck of cards. One egg equals 1 oz. ? 2 servings of low-fat dairy each day. ? A serving of nuts, seeds, or beans 5 times each week. ? Heart-healthy fats. Healthy fats called Omega-3 fatty acids are found in foods such as flaxseeds and coldwater fish, like sardines, salmon, and mackerel.  Limit how much you eat of the following: ? Canned or prepackaged foods. ? Food that is high in trans fat, such as fried foods. ? Food that is high in saturated fat, such as fatty meat. ? Sweets, desserts, sugary drinks, and other foods with added sugar. ? Full-fat dairy products.  Do not salt foods before eating.  Try to eat at least  2 vegetarian meals each week.  Eat more home-cooked food and less restaurant, buffet, and fast food.  When eating at a restaurant, ask that your food be prepared with less salt or no salt, if possible. What foods are recommended? The items listed may not be a complete list. Talk with your dietitian about what dietary choices are best for you. Grains Whole-grain or whole-wheat bread. Whole-grain or whole-wheat pasta. Brown rice. [place]. Bulgur. Whole-grain and low-sodium cereals. Pita bread. Low-fat, low-sodium crackers.  Whole-wheat flour tortillas. Vegetables Fresh or frozen vegetables (raw, steamed, roasted, or grilled). Low-sodium or reduced-sodium tomato and vegetable juice. Low-sodium or reduced-sodium tomato sauce and tomato paste. Low-sodium or reduced-sodium canned vegetables. Fruits All fresh, dried, or frozen fruit. Canned fruit in natural juice (without added sugar). Meat and other protein foods Skinless chicken or [place]. Ground chicken or [place]. Pork with fat trimmed off. Fish and seafood. Egg whites. Dried beans, peas, or lentils. Unsalted nuts, nut butters, and seeds. Unsalted canned beans. Lean cuts of beef with fat trimmed off. Low-sodium, lean deli meat. Dairy Low-fat (1%) or fat-free (skim) milk. Fat-free, low-fat, or reduced-fat cheeses. Nonfat, low-sodium ricotta or cottage cheese. Low-fat or nonfat yogurt. Low-fat, low-sodium cheese. Fats and oils Soft margarine without trans fats. Vegetable oil. Low-fat, reduced-fat, or light mayonnaise and salad dressings (reduced-sodium). Canola, safflower, olive, soybean, and sunflower oils. Avocado. Seasoning and other foods Herbs. Spices. Seasoning mixes without salt. Unsalted popcorn and pretzels. Fat-free sweets. What foods are not recommended? The items listed may not be a complete list. Talk with your dietitian about what dietary choices are best for you. Grains Baked goods made with fat, such as croissants, muffins, or some breads. Dry pasta or rice meal packs. Vegetables Creamed or fried vegetables. Vegetables in a cheese sauce. Regular canned vegetables (not low-sodium or reduced-sodium). Regular canned tomato sauce and paste (not low-sodium or reduced-sodium). Regular tomato and vegetable juice (not low-sodium or reduced-sodium). Angie Fava. Olives. Fruits Canned fruit in a light or heavy syrup. Fried fruit. Fruit in cream or butter sauce. Meat and other protein foods Fatty cuts of meat. Ribs. Fried meat. Berniece Salines. Sausage. Bologna and other  processed lunch meats. Salami. Fatback. Hotdogs. Bratwurst. Salted nuts and seeds. Canned beans with added salt. Canned or smoked fish. Whole eggs or egg yolks. Chicken or [place] with skin. Dairy Whole or 2% milk, cream, and half-and-half. Whole or full-fat cream cheese. Whole-fat or sweetened yogurt. Full-fat cheese. Nondairy creamers. Whipped toppings. Processed cheese and cheese spreads. Fats and oils Butter. Stick margarine. Lard. Shortening. Ghee. Bacon fat. Tropical oils, such as coconut, palm kernel, or palm oil. Seasoning and other foods Salted popcorn and pretzels. Onion salt, garlic salt, seasoned salt, table salt, and sea salt. Worcestershire sauce. Tartar sauce. Barbecue sauce. Teriyaki sauce. Soy sauce, including reduced-sodium. Steak sauce. Canned and packaged gravies. Fish sauce. Oyster sauce. Cocktail sauce. Horseradish that you find on the shelf. Ketchup. Mustard. Meat flavorings and tenderizers. Bouillon cubes. Hot sauce and Tabasco sauce. Premade or packaged marinades. Premade or packaged taco seasonings. Relishes. Regular salad dressings. Where to find more information:  National Heart, Lung, and [place]: https://wilson-eaton.com/  American Heart Association: www.heart.org Summary  The DASH eating plan is a healthy eating plan that has been shown to reduce high blood pressure (hypertension). It may also reduce your risk for type 2 diabetes, heart disease, and stroke.  With the DASH eating plan, you should limit salt (sodium) intake to 2,300 mg a day. If you have hypertension, you may  need to reduce your sodium intake to 1,500 mg a day.  When on the DASH eating plan, aim to eat more fresh fruits and vegetables, whole grains, lean proteins, low-fat dairy, and heart-healthy fats.  Work with your health care provider or diet and nutrition specialist (dietitian) to adjust your eating plan to your individual calorie needs. This information is not intended to replace advice given to  you by your health care provider. Make sure you discuss any questions you have with your health care provider. Document Released: 07/22/2011 Document Revised: 07/26/2016 Document Reviewed: 07/26/2016 Elsevier Interactive Patient Education  2017 [employer].

## 2017-03-01 NOTE — Progress Notes (Signed)
Subjective:  Patient ID: Stephen Schwartz, male    DOB: Jul 29, 1971  Age: 46 y.o. MRN: [id number]  CC: Urinary Frequency (pt here today c/o increased frequency of urination. He was also having swelling in both ankles but now that is better.)   HPI Stephen Schwartz presents for Off lisinopril. On creatine and noted increased BP and swelling at the ankles. Still using creatine for body building. Denies dyspnea and fatigue. Sx onset 1 week ago. Stopping the lisinopril helped wit the swelling.  Depression screen [facility] 2/9 07/27/2016 04/01/2016 01/19/2016  Decreased Interest 0 0 2  Down, Depressed, Hopeless 0 0 0  PHQ - 2 Score 0 0 2  Altered sleeping - - 0  Tired, decreased energy - - 0  Change in appetite - - 0  Feeling bad or failure about yourself  - - 0  Trouble concentrating - - 1  Moving slowly or fidgety/restless - - 0  Suicidal thoughts - - 0  PHQ-9 Score - - 3    History Stephen Schwartz has a past medical history of History of nephrolithiasis; Psoriasis; and Psoriasis.   He has no past surgical history on file.   His family history is not on file.He reports that he has never smoked. He has never used smokeless tobacco. He reports that he does not drink alcohol or use drugs.    ROS Review of Systems  Constitutional: Negative for chills, diaphoresis, fever and unexpected weight change.  HENT: Negative for congestion, hearing loss, rhinorrhea and sore throat.   Eyes: Negative for visual disturbance.  Respiratory: Negative for cough and shortness of breath.   Cardiovascular: Negative for chest pain.  Gastrointestinal: Negative for abdominal pain, constipation and diarrhea.  Genitourinary: Positive for frequency and testicular pain. Negative for discharge, dysuria and flank pain.  Musculoskeletal: Negative for arthralgias and joint swelling.  Skin: Negative for rash.  Neurological: Negative for dizziness and headaches.  Psychiatric/Behavioral: Negative for dysphoric mood and sleep  disturbance.    Objective:  BP (!) 158/98   Pulse (!) 51   Temp 99.5 F (37.5 C) (Oral)   Ht _0  (1.803 m)   Wt 233 lb (105.7 kg)   BMI 32.50 kg/m   BP Readings from Last 3 Encounters:  03/01/17 (!) 158/98  08/17/16 157/97  07/27/16 138/88    Wt Readings from Last 3 Encounters:  03/01/17 233 lb (105.7 kg)  08/17/16 220 lb (99.8 kg)  07/27/16 223 lb (101.2 kg)     Physical Exam  Constitutional: He is oriented to person, place, and time. He appears well-developed and well-nourished. No distress.  HENT:  Head: Normocephalic and atraumatic.  Right Ear: External ear normal.  Left Ear: External ear normal.  Nose: Nose normal.  Mouth/Throat: Oropharynx is clear and moist.  Eyes: Pupils are equal, round, and reactive to light. Conjunctivae and EOM are normal.  Neck: Normal range of motion. Neck supple. No thyromegaly present.  Cardiovascular: Normal rate, regular rhythm and normal heart sounds.   No murmur heard. Pulmonary/Chest: Effort normal and breath sounds normal. No respiratory distress. He has no wheezes. He has no rales.  Abdominal: Soft. Bowel sounds are normal. He exhibits no distension. There is no tenderness.  Musculoskeletal: Normal range of motion. He exhibits edema (1+ both ankles). He exhibits no tenderness.  Lymphadenopathy:    He has no cervical adenopathy.  Neurological: He is alert and oriented to person, place, and time. He has normal reflexes.  Skin: Skin is warm and dry.  Psychiatric: He has a normal mood and affect. His behavior is normal. Judgment and thought content normal.      Assessment & Plan:   Stephen Schwartz was seen today for urinary frequency.  Diagnoses and all orders for this visit:  Essential hypertension -     Urine Culture -     Urinalysis, Complete -     CMP14+EGFR  Localized edema -     Urine Culture -     Urinalysis, Complete -     CMP14+EGFR  Prostatitis, acute  Other orders -     Microscopic Examination -     Specimen  Status       I am having Stephen Schwartz maintain his Salicylic Acid, lisinopril, and doxycycline.  Allergies as of 03/01/2017   No Known Allergies     Medication List       Accurate as of 03/01/17 11:59 PM. Always use your most recent med list.          doxycycline 100 MG capsule Commonly known as:  VIBRAMYCIN Take 100 mg by mouth daily.   lisinopril 10 MG tablet Commonly known as:  PRINIVIL,ZESTRIL Take 1 tablet (10 mg total) by mouth daily.   Salicylic Acid 6 % Sham USE AS DIRECTED     Discontinue creatine. Instead, use an L-arginine supplement and a protein supplement with BCAs (branch chain amino acids) Take doxycycline on an empty stomach twice a day for two weeks. Consider emphasizing definiition instead of bulk with your weight training. This will help your blood pressure. (Use higher sets & reps with lower weight) Use DASH:    Follow-up: Return in about 1 month (around 04/01/2017).  Claretta Fraise, M.D.

## 2017-03-02 LAB — CMP14+EGFR
ALBUMIN: 4.4 g/dL (ref 3.5–5.5)
ALK PHOS: 67 IU/L (ref 39–117)
ALT: 33 IU/L (ref 0–44)
AST: 31 IU/L (ref 0–40)
Albumin/Globulin Ratio: 1.6 (ref 1.2–2.2)
BILIRUBIN TOTAL: 1.2 mg/dL (ref 0.0–1.2)
BUN/Creatinine Ratio: 7 — ABNORMAL LOW (ref 9–20)
BUN: 11 mg/dL (ref 6–24)
CHLORIDE: 102 mmol/L (ref 96–106)
CO2: 23 mmol/L (ref 20–29)
CREATININE: 1.56 mg/dL — AB (ref 0.76–1.27)
Calcium: 9.2 mg/dL (ref 8.7–10.2)
GFR calc Af Amer: 61 mL/min/{1.73_m2} (ref >59)
GFR calc non Af Amer: 53 mL/min/{1.73_m2} — ABNORMAL LOW (ref >59)
GLUCOSE: 104 mg/dL — AB (ref 65–99)
Globulin, Total: 2.8 g/dL (ref 1.5–4.5)
Potassium: 5.5 mmol/L — ABNORMAL HIGH (ref 3.5–5.2)
Sodium: 144 mmol/L (ref 134–144)
TOTAL PROTEIN: 7.2 g/dL (ref 6.0–8.5)

## 2017-03-02 LAB — SPECIMEN STATUS

## 2017-03-03 ENCOUNTER — Other Ambulatory Visit: Payer: Self-pay | Admitting: *Deleted

## 2017-03-03 ENCOUNTER — Telehealth: Payer: Self-pay | Admitting: Family Medicine

## 2017-03-03 DIAGNOSIS — R899 Unspecified abnormal finding in specimens from other organs, systems and tissues: Secondary | ICD-10-CM

## 2017-03-03 NOTE — Telephone Encounter (Signed)
Pt aware of results and will come in next week for repeat BMP.

## 2017-03-05 LAB — URINE CULTURE

## 2017-03-06 ENCOUNTER — Other Ambulatory Visit: Payer: Self-pay | Admitting: Family Medicine

## 2017-03-06 MED ORDER — CIPROFLOXACIN HCL 500 MG PO TABS: 500.0000 mg | ORAL_TABLET | Freq: Two times a day (BID) | ORAL | 0 refills | Status: DC

## 2017-03-11 ENCOUNTER — Other Ambulatory Visit: Payer: BLUE CROSS/BLUE SHIELD

## 2017-03-11 DIAGNOSIS — R899 Unspecified abnormal finding in specimens from other organs, systems and tissues: Secondary | ICD-10-CM

## 2017-03-12 ENCOUNTER — Other Ambulatory Visit: Payer: Self-pay | Admitting: Family Medicine

## 2017-03-12 LAB — BMP8+EGFR
BUN / CREAT RATIO: 7 — AB (ref 9–20)
BUN: 13 mg/dL (ref 6–24)
CALCIUM: 9.3 mg/dL (ref 8.7–10.2)
CHLORIDE: 103 mmol/L (ref 96–106)
CO2: 24 mmol/L (ref 20–29)
Creatinine, Ser: 1.82 mg/dL — ABNORMAL HIGH (ref 0.76–1.27)
GFR calc Af Amer: 51 mL/min/{1.73_m2} — ABNORMAL LOW (ref >59)
GFR calc non Af Amer: 44 mL/min/{1.73_m2} — ABNORMAL LOW (ref >59)
GLUCOSE: 98 mg/dL (ref 65–99)
Potassium: 5.2 mmol/L (ref 3.5–5.2)
Sodium: 142 mmol/L (ref 134–144)

## 2017-03-14 ENCOUNTER — Other Ambulatory Visit: Payer: Self-pay | Admitting: Family Medicine

## 2017-03-14 ENCOUNTER — Telehealth: Payer: Self-pay | Admitting: Family Medicine

## 2017-03-14 MED ORDER — DILTIAZEM HCL ER COATED BEADS 120 MG PO CP24: 120.0000 mg | ORAL_CAPSULE | Freq: Every day | ORAL | 2 refills | Status: DC

## 2017-03-14 NOTE — Telephone Encounter (Signed)
Please review and advise.

## 2017-03-14 NOTE — Telephone Encounter (Signed)
Please contact the patient I sent in Cardizem. This should stimulate his kidneys as well as lower his blood pressure.

## 2017-03-14 NOTE — Telephone Encounter (Signed)
Pt notified of RX 

## 2017-03-17 ENCOUNTER — Other Ambulatory Visit: Payer: Self-pay | Admitting: *Deleted

## 2017-03-17 ENCOUNTER — Other Ambulatory Visit: Payer: BLUE CROSS/BLUE SHIELD

## 2017-03-17 DIAGNOSIS — R7989 Other specified abnormal findings of blood chemistry: Secondary | ICD-10-CM

## 2017-03-18 LAB — BMP8+EGFR
BUN/Creatinine Ratio: 9 (ref 9–20)
BUN: 13 mg/dL (ref 6–24)
CALCIUM: 8.9 mg/dL (ref 8.7–10.2)
CO2: 23 mmol/L (ref 20–29)
Chloride: 106 mmol/L (ref 96–106)
Creatinine, Ser: 1.47 mg/dL — ABNORMAL HIGH (ref 0.76–1.27)
GFR calc Af Amer: 66 mL/min/{1.73_m2} (ref >59)
GFR calc non Af Amer: 57 mL/min/{1.73_m2} — ABNORMAL LOW (ref >59)
Glucose: 114 mg/dL — ABNORMAL HIGH (ref 65–99)
POTASSIUM: 4.5 mmol/L (ref 3.5–5.2)
SODIUM: 142 mmol/L (ref 134–144)

## 2017-07-21 ENCOUNTER — Ambulatory Visit: Payer: BLUE CROSS/BLUE SHIELD | Admitting: Nurse Practitioner

## 2017-10-11 ENCOUNTER — Encounter: Payer: Self-pay | Admitting: Family Medicine

## 2017-10-11 ENCOUNTER — Ambulatory Visit: Payer: BLUE CROSS/BLUE SHIELD | Admitting: Family Medicine

## 2017-10-11 VITALS — BP 143/91 | HR 71 | Temp 96.9°F | Ht 71.0 in | Wt 226.0 lb

## 2017-10-11 DIAGNOSIS — R399 Unspecified symptoms and signs involving the genitourinary system: Secondary | ICD-10-CM

## 2017-10-11 DIAGNOSIS — N410 Acute prostatitis: Secondary | ICD-10-CM | POA: Diagnosis not present

## 2017-10-11 LAB — URINALYSIS
BILIRUBIN UA: NEGATIVE
GLUCOSE, UA: NEGATIVE
Leukocytes, UA: NEGATIVE
Nitrite, UA: NEGATIVE
Specific Gravity, UA: 1.025 (ref 1.005–1.030)
UUROB: 0.2 mg/dL (ref 0.2–1.0)
pH, UA: 5.5 (ref 5.0–7.5)

## 2017-10-11 MED ORDER — DICLOFENAC SODIUM 75 MG PO TBEC: 75.0000 mg | DELAYED_RELEASE_TABLET | Freq: Two times a day (BID) | ORAL | 2 refills | Status: DC

## 2017-10-11 MED ORDER — DOXYCYCLINE HYCLATE 100 MG PO TABS: 100.0000 mg | ORAL_TABLET | Freq: Two times a day (BID) | ORAL | 0 refills | Status: DC

## 2017-10-11 MED ORDER — DILTIAZEM HCL ER COATED BEADS 120 MG PO CP24: 120.0000 mg | ORAL_CAPSULE | Freq: Every day | ORAL | 2 refills | Status: DC

## 2017-10-11 NOTE — Progress Notes (Signed)
Chief Complaint  Patient presents with  . Urinary Urgency    HPI  Patient presents today for burning with urination and frequency for several days. Denies fever . No flank pain. No nausea, vomiting.   PMH: Smoking status noted ROS: Per HPI  Objective: BP (!) 143/91   Pulse 71   Temp (!) 96.9 F (36.1 C) (Oral)   Ht 5\' 11"  (1.803 m)   Wt 226 lb (102.5 kg)   BMI 31.52 kg/m  Gen: NAD, alert, cooperative with exam HEENT: NCAT, EOMI, PERRL CV: RRR, good S1/S2, no murmur Resp: CTABL, no wheezes, non-labored Abd: SNTND, BS present, no guarding or organomegaly Ext: No edema, warm Neuro: Alert and oriented, No gross deficits  Assessment and plan:  1. Acute prostatitis   2. UTI symptoms     Meds ordered this encounter  Medications  . diltiazem (CARDIZEM CD) 120 MG 24 hr capsule    Sig: Take 1 capsule (120 mg total) by mouth daily. For blood pressure    Dispense:  30 capsule    Refill:  2  . diclofenac (VOLTAREN) 75 MG EC tablet    Sig: Take 1 tablet (75 mg total) by mouth 2 (two) times daily.    Dispense:  60 tablet    Refill:  2  . doxycycline (VIBRA-TABS) 100 MG tablet    Sig: Take 1 tablet (100 mg total) by mouth 2 (two) times daily.    Dispense:  28 tablet    Refill:  0    Orders Placed This Encounter  Procedures  . Urine Culture  . Urinalysis    Follow up as needed.  Claretta Fraise, MD

## 2017-10-12 LAB — URINE CULTURE: Organism ID, Bacteria: NO GROWTH

## 2017-11-08 ENCOUNTER — Ambulatory Visit: Payer: BLUE CROSS/BLUE SHIELD | Admitting: Family Medicine

## 2017-11-15 ENCOUNTER — Ambulatory Visit: Payer: BLUE CROSS/BLUE SHIELD | Admitting: Family Medicine

## 2017-11-15 ENCOUNTER — Encounter: Payer: Self-pay | Admitting: Family Medicine

## 2017-11-15 VITALS — BP 147/94 | HR 87 | Ht 71.0 in | Wt 223.4 lb

## 2017-11-15 DIAGNOSIS — I10 Essential (primary) hypertension: Secondary | ICD-10-CM

## 2017-11-15 LAB — URINALYSIS
Bilirubin, UA: NEGATIVE
Glucose, UA: NEGATIVE
Ketones, UA: NEGATIVE
Leukocytes, UA: NEGATIVE
Nitrite, UA: NEGATIVE
PH UA: 7.5 (ref 5.0–7.5)
Protein, UA: NEGATIVE
Specific Gravity, UA: 1.02 (ref 1.005–1.030)
UUROB: 0.2 mg/dL (ref 0.2–1.0)

## 2017-11-15 MED ORDER — DILTIAZEM HCL ER COATED BEADS 240 MG PO CP24: 240.0000 mg | ORAL_CAPSULE | Freq: Every day | ORAL | 5 refills | Status: DC

## 2017-11-15 MED ORDER — DICLOFENAC SODIUM 75 MG PO TBEC: 75.0000 mg | DELAYED_RELEASE_TABLET | Freq: Two times a day (BID) | ORAL | 2 refills | Status: DC

## 2017-11-15 NOTE — Patient Instructions (Signed)
For muscle building safely use:  Brunch Chain Amino Acids (BCA's) L-arginine Carnitine is a good fat burner

## 2017-11-15 NOTE — Progress Notes (Signed)
Subjective:  Patient ID: Stephen Schwartz, male    DOB: 14-Jul-1971  Age: 47 y.o. MRN: [id number]  CC: Hypertension (pt here today for routine follow up of his chronic medical conditions)   HPI Stephen Schwartz presents for follow-up of hypertension. Patient has no history of headache chest pain or shortness of breath or recent cough. Patient also denies symptoms of TIA such as numbness weakness lateralizing.  He has had some high readings outside the office.  Patient denies side effects from his medication. States taking it regularly.  He still uses the diclofenac for joint pains as well.   Depression screen [facility] 2/9 11/15/2017 10/11/2017 07/27/2016  Decreased Interest 0 0 0  Down, Depressed, Hopeless 0 0 0  PHQ - 2 Score 0 0 0  Altered sleeping - - -  Tired, decreased energy - - -  Change in appetite - - -  Feeling bad or failure about yourself  - - -  Trouble concentrating - - -  Moving slowly or fidgety/restless - - -  Suicidal thoughts - - -  PHQ-9 Score - - -    History Stephen Schwartz has a past medical history of History of nephrolithiasis, Psoriasis, and Psoriasis.   He has no past surgical history on file.   His family history is not on file.He reports that he has never smoked. He has never used smokeless tobacco. He reports that he does not drink alcohol or use drugs.    ROS Review of Systems  Constitutional: Negative for chills, diaphoresis and fever.  HENT: Negative for rhinorrhea and sore throat.   Respiratory: Negative for cough and shortness of breath.   Cardiovascular: Negative for chest pain.  Gastrointestinal: Negative for abdominal pain.  Musculoskeletal: Negative for arthralgias and myalgias.  Skin: Negative for rash.  Neurological: Negative for weakness and headaches.    Objective:  BP (!) 147/94   Pulse 87   Ht 5\' 11"  (1.803 m)   Wt 223 lb 6 oz (101.3 kg)   BMI 31.15 kg/m    BP Readings from Last 3 Encounters:  11/15/17 (!) 147/94  10/11/17 (!) 143/91    03/01/17 (!) 158/98    Wt Readings from Last 3 Encounters:  11/15/17 223 lb 6 oz (101.3 kg)  10/11/17 226 lb (102.5 kg)  03/01/17 233 lb (105.7 kg)     Physical Exam  Constitutional: He is oriented to person, place, and time. He appears well-developed and well-nourished.  HENT:  Head: Normocephalic and atraumatic.  Right Ear: External ear normal.  Left Ear: External ear normal.  Mouth/Throat: No oropharyngeal exudate or posterior oropharyngeal erythema.  Eyes: Pupils are equal, round, and reactive to light.  Neck: Normal range of motion. Neck supple.  Cardiovascular: Normal rate and regular rhythm.  No murmur heard. Pulmonary/Chest: Breath sounds normal. No respiratory distress.  Musculoskeletal: Normal range of motion.  Neurological: He is alert and oriented to person, place, and time.  Skin: Skin is warm and dry.  Psychiatric: He has a normal mood and affect.  Vitals reviewed.     Assessment & Plan:   Harim was seen today for hypertension.  Diagnoses and all orders for this visit:  Essential hypertension -     Urinalysis  Other orders -     diltiazem (CARDIZEM CD) 240 MG 24 hr capsule; Take 1 capsule (240 mg total) by mouth daily. -     diclofenac (VOLTAREN) 75 MG EC tablet; Take 1 tablet (75 mg total) by mouth 2 (  two) times daily.       I have discontinued Jeneen Rinks T. Ey's diltiazem and doxycycline. I am also having him start on diltiazem. Additionally, I am having him maintain his Salicylic Acid and diclofenac.  Allergies as of 11/15/2017   No Known Allergies     Medication List        Accurate as of 11/15/17 11:59 PM. Always use your most recent med list.          diclofenac 75 MG EC tablet Commonly known as:  VOLTAREN Take 1 tablet (75 mg total) by mouth 2 (two) times daily.   diltiazem 240 MG 24 hr capsule Commonly known as:  CARDIZEM CD Take 1 capsule (240 mg total) by mouth daily.   Salicylic Acid 6 % Sham USE AS DIRECTED      Due to  elevated blood pressure readings his diltiazem dose has been bumped upward.  Follow-up: Return in about 2 months (around 01/15/2018) for hypertension.  Claretta Fraise, M.D.

## 2017-11-20 ENCOUNTER — Encounter: Payer: Self-pay | Admitting: Family Medicine

## 2017-12-28 ENCOUNTER — Telehealth: Payer: Self-pay | Admitting: Family Medicine

## 2017-12-28 NOTE — Telephone Encounter (Signed)
Spoke with pharmacy and they are filling rx- patient aware.

## 2018-01-20 ENCOUNTER — Ambulatory Visit: Payer: BLUE CROSS/BLUE SHIELD | Admitting: Family Medicine

## 2018-01-26 ENCOUNTER — Ambulatory Visit: Payer: BLUE CROSS/BLUE SHIELD | Admitting: Family Medicine

## 2018-01-26 ENCOUNTER — Encounter: Payer: Self-pay | Admitting: Family Medicine

## 2018-01-26 VITALS — BP 149/95 | HR 52 | Temp 97.3°F | Ht 71.0 in | Wt 219.4 lb

## 2018-01-26 DIAGNOSIS — Z125 Encounter for screening for malignant neoplasm of prostate: Secondary | ICD-10-CM | POA: Diagnosis not present

## 2018-01-26 DIAGNOSIS — I10 Essential (primary) hypertension: Secondary | ICD-10-CM

## 2018-01-26 DIAGNOSIS — Z13220 Encounter for screening for lipoid disorders: Secondary | ICD-10-CM | POA: Diagnosis not present

## 2018-01-26 MED ORDER — DILTIAZEM HCL ER COATED BEADS 360 MG PO CP24: 360.0000 mg | ORAL_CAPSULE | Freq: Every day | ORAL | 1 refills | Status: DC

## 2018-01-26 NOTE — Progress Notes (Signed)
Subjective:  Patient ID: Stephen Schwartz, male    DOB: [birth year]  Age: 47 y.o. MRN: [id number]  CC: Blood Pressure Check (pt here today following up after increasing his Cardizem)   HPI Stephen Schwartz presents for  follow-up of hypertension. Patient has no history of headache chest pain or shortness of breath or recent cough. Patient also denies symptoms of TIA such as focal numbness or weakness. Patient denies side effects from medication. States taking it regularly.  Patient reports that he has had multiple readings at home of 150/100.  The highest reading was a systolic of 915.   History Stephen Schwartz has a past medical history of History of nephrolithiasis, Psoriasis, and Psoriasis.   He has no past surgical history on file.   His family history is not on file.He reports that he has never smoked. He has never used smokeless tobacco. He reports that he does not drink alcohol or use drugs.  Current Outpatient Medications on File Prior to Visit  Medication Sig Dispense Refill  . diclofenac (VOLTAREN) 75 MG EC tablet Take 1 tablet (75 mg total) by mouth 2 (two) times daily. 60 tablet 2  . Salicylic Acid 6 % SHAM USE AS DIRECTED 177 mL 11   No current facility-administered medications on file prior to visit.     ROS Review of Systems  Constitutional: Negative for fever.  Respiratory: Negative for shortness of breath.   Cardiovascular: Negative for chest pain.  Musculoskeletal: Negative for arthralgias.  Skin: Negative for rash.    Objective:  BP (!) 149/95   Pulse (!) 52   Temp (!) 97.3 F (36.3 C) (Oral)   Ht '5\' 11"'  (1.803 m)   Wt 219 lb 6 oz (99.5 kg)   BMI 30.60 kg/m   BP Readings from Last 3 Encounters:  01/26/18 (!) 149/95  11/15/17 (!) 147/94  10/11/17 (!) 143/91    Wt Readings from Last 3 Encounters:  01/26/18 219 lb 6 oz (99.5 kg)  11/15/17 223 lb 6 oz (101.3 kg)  10/11/17 226 lb (102.5 kg)     Physical Exam  Constitutional: He is oriented to person, place,  and time. He appears well-developed and well-nourished.  HENT:  Head: Normocephalic and atraumatic.  Right Ear: External ear normal.  Left Ear: External ear normal.  Mouth/Throat: No oropharyngeal exudate or posterior oropharyngeal erythema.  Eyes: Pupils are equal, round, and reactive to light.  Neck: Normal range of motion. Neck supple.  Cardiovascular: Normal rate and regular rhythm.  No murmur heard. Pulmonary/Chest: Breath sounds normal. No respiratory distress.  Neurological: He is alert and oriented to person, place, and time.  Vitals reviewed.     Assessment & Plan:   Bond was seen today for blood pressure check.  Diagnoses and all orders for this visit:  Essential hypertension -     CBC with Differential/Platelet -     CMP14+EGFR  Screening for prostate cancer -     PSA, total and free  Screening for cholesterol level -     Lipid panel  Other orders -     diltiazem (CARDIZEM CD) 360 MG 24 hr capsule; Take 1 capsule (360 mg total) by mouth at bedtime.   Allergies as of 01/26/2018   No Known Allergies     Medication List        Accurate as of 01/26/18 11:59 PM. Always use your most recent med list.          diclofenac 75 MG EC  tablet Commonly known as:  VOLTAREN Take 1 tablet (75 mg total) by mouth 2 (two) times daily.   diltiazem 360 MG 24 hr capsule Commonly known as:  CARDIZEM CD Take 1 capsule (360 mg total) by mouth at bedtime.   Salicylic Acid 6 % Sham USE AS DIRECTED       Meds ordered this encounter  Medications  . diltiazem (CARDIZEM CD) 360 MG 24 hr capsule    Sig: Take 1 capsule (360 mg total) by mouth at bedtime.    Dispense:  30 capsule    Refill:  1      Follow-up: Return in about 1 month (around 02/25/2018).  Claretta Fraise, M.D.

## 2018-01-27 LAB — LIPID PANEL
CHOL/HDL RATIO: 2.8 ratio (ref 0.0–5.0)
Cholesterol, Total: 103 mg/dL (ref 100–199)
HDL: 37 mg/dL — ABNORMAL LOW (ref >39)
LDL CALC: 53 mg/dL (ref 0–99)
Triglycerides: 63 mg/dL (ref 0–149)
VLDL Cholesterol Cal: 13 mg/dL (ref 5–40)

## 2018-01-27 LAB — CBC WITH DIFFERENTIAL/PLATELET
Basophils Absolute: 0 10*3/uL (ref 0.0–0.2)
Basos: 1 %
EOS (ABSOLUTE): 0.2 10*3/uL (ref 0.0–0.4)
EOS: 4 %
HEMATOCRIT: 41.5 % (ref 37.5–51.0)
HEMOGLOBIN: 14 g/dL (ref 13.0–17.7)
Immature Grans (Abs): 0 10*3/uL (ref 0.0–0.1)
Immature Granulocytes: 0 %
LYMPHS ABS: 1.8 10*3/uL (ref 0.7–3.1)
Lymphs: 44 %
MCH: 26.2 pg — ABNORMAL LOW (ref 26.6–33.0)
MCHC: 33.7 g/dL (ref 31.5–35.7)
MCV: 78 fL — ABNORMAL LOW (ref 79–97)
MONOCYTES: 6 %
Monocytes Absolute: 0.3 10*3/uL (ref 0.1–0.9)
Neutrophils Absolute: 1.9 10*3/uL (ref 1.4–7.0)
Neutrophils: 45 %
Platelets: 218 10*3/uL (ref 150–450)
RBC: 5.34 x10E6/uL (ref 4.14–5.80)
RDW: 15.3 % (ref 12.3–15.4)
WBC: 4.2 10*3/uL (ref 3.4–10.8)

## 2018-01-27 LAB — CMP14+EGFR
ALBUMIN: 4.4 g/dL (ref 3.5–5.5)
ALK PHOS: 70 IU/L (ref 39–117)
ALT: 22 IU/L (ref 0–44)
AST: 18 IU/L (ref 0–40)
Albumin/Globulin Ratio: 1.7 (ref 1.2–2.2)
BUN / CREAT RATIO: 10 (ref 9–20)
BUN: 13 mg/dL (ref 6–24)
Bilirubin Total: 1.1 mg/dL (ref 0.0–1.2)
CALCIUM: 9 mg/dL (ref 8.7–10.2)
CO2: 22 mmol/L (ref 20–29)
Chloride: 104 mmol/L (ref 96–106)
Creatinine, Ser: 1.27 mg/dL (ref 0.76–1.27)
GFR calc Af Amer: 78 mL/min/{1.73_m2} (ref >59)
GFR, EST NON AFRICAN AMERICAN: 67 mL/min/{1.73_m2} (ref >59)
GLOBULIN, TOTAL: 2.6 g/dL (ref 1.5–4.5)
Glucose: 97 mg/dL (ref 65–99)
Potassium: 4.2 mmol/L (ref 3.5–5.2)
SODIUM: 141 mmol/L (ref 134–144)
Total Protein: 7 g/dL (ref 6.0–8.5)

## 2018-01-27 LAB — PSA, TOTAL AND FREE
PROSTATE SPECIFIC AG, SERUM: 3.5 ng/mL (ref 0.0–4.0)
PSA FREE PCT: 29.7 %
PSA FREE: 1.04 ng/mL

## 2018-01-30 ENCOUNTER — Encounter: Payer: Self-pay | Admitting: Family Medicine

## 2018-03-08 ENCOUNTER — Ambulatory Visit: Payer: BLUE CROSS/BLUE SHIELD | Admitting: Family Medicine

## 2018-03-22 ENCOUNTER — Ambulatory Visit: Payer: BLUE CROSS/BLUE SHIELD | Admitting: Family Medicine

## 2018-03-31 ENCOUNTER — Encounter: Payer: Self-pay | Admitting: Family Medicine

## 2018-03-31 ENCOUNTER — Ambulatory Visit: Payer: BLUE CROSS/BLUE SHIELD | Admitting: Family Medicine

## 2018-03-31 VITALS — BP 133/84 | HR 54 | Temp 97.0°F | Ht 71.0 in | Wt 219.2 lb

## 2018-03-31 DIAGNOSIS — I10 Essential (primary) hypertension: Secondary | ICD-10-CM

## 2018-03-31 NOTE — Progress Notes (Signed)
Subjective:  Patient ID: Stephen Schwartz, male    DOB: 05-28-1971  Age: 47 y.o. MRN: [id number]  CC: Blood Pressure Check   HPI Stephen Schwartz presents for  follow-up of hypertension. Patient has no history of headache chest pain or shortness of breath or recent cough. Patient also denies symptoms of TIA such as focal numbness or weakness. Patient denies side effects from medication. States taking it regularly.   History Stephen Schwartz has a past medical history of History of nephrolithiasis, Psoriasis, and Psoriasis.   He has no past surgical history on file.   His family history is not on file.He reports that he has never smoked. He has never used smokeless tobacco. He reports that he does not drink alcohol or use drugs.  Current Outpatient Medications on File Prior to Visit  Medication Sig Dispense Refill  . diclofenac (VOLTAREN) 75 MG EC tablet Take 1 tablet (75 mg total) by mouth 2 (two) times daily. 60 tablet 2  . diltiazem (CARDIZEM CD) 360 MG 24 hr capsule Take 1 capsule (360 mg total) by mouth at bedtime. 30 capsule 1  . Salicylic Acid 6 % SHAM USE AS DIRECTED 177 mL 11   No current facility-administered medications on file prior to visit.     ROS Review of Systems  Constitutional: Negative for fever.  Respiratory: Negative for shortness of breath.   Cardiovascular: Negative for chest pain.  Musculoskeletal: Negative for arthralgias.  Skin: Negative for rash.    Objective:  BP 133/84   Pulse (!) 54   Temp (!) 97 F (36.1 C) (Oral)   Ht 5\' 11"  (1.803 m)   Wt 219 lb 4 oz (99.5 kg)   BMI 30.58 kg/m   BP Readings from Last 3 Encounters:  03/31/18 133/84  01/26/18 (!) 149/95  11/15/17 (!) 147/94    Wt Readings from Last 3 Encounters:  03/31/18 219 lb 4 oz (99.5 kg)  01/26/18 219 lb 6 oz (99.5 kg)  11/15/17 223 lb 6 oz (101.3 kg)     Physical Exam  Constitutional: He is oriented to person, place, and time. He appears well-developed and well-nourished.  HENT:    Head: Normocephalic and atraumatic.  Right Ear: External ear normal.  Left Ear: External ear normal.  Mouth/Throat: No oropharyngeal exudate or posterior oropharyngeal erythema.  Eyes: Pupils are equal, round, and reactive to light.  Neck: Normal range of motion. Neck supple.  Cardiovascular: Normal rate and regular rhythm.  No murmur heard. Pulmonary/Chest: Breath sounds normal. No respiratory distress.  Neurological: He is alert and oriented to person, place, and time.  Skin: Skin is warm and dry.  Psychiatric: He has a normal mood and affect. His behavior is normal.  Vitals reviewed.     Assessment & Plan:   There are no diagnoses linked to this encounter. Allergies as of 03/31/2018   No Known Allergies     Medication List        Accurate as of 03/31/18 11:39 AM. Always use your most recent med list.          diclofenac 75 MG EC tablet Commonly known as:  VOLTAREN Take 1 tablet (75 mg total) by mouth 2 (two) times daily.   diltiazem 360 MG 24 hr capsule Commonly known as:  CARDIZEM CD Take 1 capsule (360 mg total) by mouth at bedtime.   Salicylic Acid 6 % Sham USE AS DIRECTED       No orders of the defined types were placed  in this encounter.   Pt. Will monitor pulse. If drops below 50 routinely, will consider decrease of diltiazem and addition of non chronotropic BP med.  Follow-up: Return in about 4 months (around 07/31/2018) for hypertension, Wellness.  Claretta Fraise, M.D.

## 2018-04-16 ENCOUNTER — Other Ambulatory Visit: Payer: Self-pay | Admitting: Family Medicine

## 2018-11-29 ENCOUNTER — Ambulatory Visit (INDEPENDENT_AMBULATORY_CARE_PROVIDER_SITE_OTHER): Payer: BLUE CROSS/BLUE SHIELD | Admitting: Family Medicine

## 2018-11-29 ENCOUNTER — Other Ambulatory Visit: Payer: Self-pay

## 2018-11-29 DIAGNOSIS — L409 Psoriasis, unspecified: Secondary | ICD-10-CM

## 2018-11-29 MED ORDER — SALICYLIC ACID 6 % EX SHAM: MEDICATED_SHAMPOO | CUTANEOUS | 11 refills | Status: DC

## 2018-11-29 NOTE — Progress Notes (Signed)
Telephone visit  Subjective: CC: psoriasis PCP: Claretta Fraise, MD [id number] Stephen Schwartz is a 48 y.o. male calls for telephone consult today. Patient provides verbal consent for consult held via phone.  Location of patient: home Location of provider: [facility] Others present for call: wife  1. Psoriasis Patient reports longstanding history of psoriasis primarily affecting the scalp.  He describes an itchy, flaky scalp that is relieved by use of salicylic acid shampoo.  He is out of this medication and needs a refill on it.   ROS: Per HPI  No Known Allergies Past Medical History:  Diagnosis Date  . History of nephrolithiasis   . Psoriasis   . Psoriasis     Current Outpatient Medications:  .  diclofenac (VOLTAREN) 75 MG EC tablet, Take 1 tablet (75 mg total) by mouth 2 (two) times daily., Disp: 60 tablet, Rfl: 2 .  diltiazem (CARDIZEM CD) 360 MG 24 hr capsule, TAKE 1 CAPSULE (360 MG TOTAL) BY MOUTH AT BEDTIME., Disp: 90 capsule, Rfl: 1 .  Salicylic Acid 6 % SHAM, USE AS DIRECTED, Disp: 177 mL, Rfl: 11  Assessment/ Plan: 48 y.o. male   1. Psoriasis Stable and responsive to daily use of salicylic acid shampoo.  This is been refilled.  He will follow-up with PCP PRN other needs. - Salicylic Acid 6 % SHAM; USE AS DIRECTED  Dispense: 177 mL; Refill: 11   Start time: 9:40am End time: 9:45am  Total time spent on patient care (including telephone call/ virtual visit): 10 minutes  [place], [place] [phone]

## 2018-12-25 ENCOUNTER — Telehealth: Payer: Self-pay | Admitting: Family Medicine

## 2019-01-04 ENCOUNTER — Encounter: Payer: Self-pay | Admitting: Family

## 2019-01-04 ENCOUNTER — Telehealth: Payer: Self-pay | Admitting: Family Medicine

## 2019-01-04 ENCOUNTER — Other Ambulatory Visit: Payer: Self-pay

## 2019-01-04 ENCOUNTER — Ambulatory Visit (INDEPENDENT_AMBULATORY_CARE_PROVIDER_SITE_OTHER): Payer: Self-pay | Admitting: Family

## 2019-01-04 DIAGNOSIS — J069 Acute upper respiratory infection, unspecified: Secondary | ICD-10-CM

## 2019-01-04 MED ORDER — FLUTICASONE PROPIONATE 50 MCG/ACT NA SUSP: 2.0000 | Freq: Every day | NASAL | 6 refills | Status: AC

## 2019-01-04 NOTE — Telephone Encounter (Signed)
Pt states when he was on the phone with you this morning for his visit he forgot to mention general weakness and fatigue that has just started. He is worried about the Covid-19 and wanted me to send you a message so you knew about this symptom and see if it changed course of treatment.

## 2019-01-04 NOTE — Telephone Encounter (Signed)
Pt called - aware of [place] advice

## 2019-01-04 NOTE — Telephone Encounter (Signed)
As discussed on the phone, his symptoms could possibly be from [place]. Our treatment plan, since it is viral and self limiting is rest, force fluids. Tylenol as needed, and self quarantine. Let us know if your symptoms worsen or do not improve.

## 2019-01-04 NOTE — Progress Notes (Signed)
   Virtual Visit via telephone Note  I connected with Stephen Schwartz on 01/04/19 at 9:01 Am  by telephone and verified that I am speaking with the correct person using two identifiers. Stephen Schwartz is currently located at home and no one is currently with her during visit. The provider, Evelina Dun, FNP is located in their office at time of visit.  I discussed the limitations, risks, security and privacy concerns of performing an evaluation and management service by telephone and the availability of in person appointments. I also discussed with the patient that there may be a patient responsible charge related to this service. The patient expressed understanding and agreed to proceed.   History and Present Illness:  Sore Throat   This is a new problem. The current episode started in the past 7 days. The problem has been unchanged. There has been no fever. The pain is at a severity of 1/10. The pain is mild. Associated symptoms include coughing, a hoarse voice, shortness of breath and trouble swallowing. Pertinent negatives include no congestion, ear discharge, ear pain or headaches. He has tried acetaminophen and NSAIDs for the symptoms. The treatment provided mild relief.      Review of Systems  HENT: Positive for hoarse voice and trouble swallowing. Negative for congestion, ear discharge and ear pain.   Respiratory: Positive for cough and shortness of breath.   Neurological: Negative for headaches.  All other systems reviewed and are negative.    Observations/Objective: No SOB or distress noted, hoarse voice noted  Assessment and Plan: 1. Viral URI - Take meds as prescribed - Use a cool mist humidifier  -Use saline nose sprays frequently -Force fluids -For any cough or congestion  Use plain Mucinex- regular strength or max strength is fine -For fever or aces or pains- take tylenol or ibuprofen. -Throat lozenges if help -New toothbrush in 3 days Call the office if symptoms  worsen or do not improve - fluticasone (FLONASE) 50 MCG/ACT nasal spray; Place 2 sprays into both nostrils daily.  Dispense: 16 g; Refill: 6     I discussed the assessment and treatment plan with the patient. The patient was provided an opportunity to ask questions and all were answered. The patient agreed with the plan and demonstrated an understanding of the instructions.   The patient was advised to call back or seek an in-person evaluation if the symptoms worsen or if the condition fails to improve as anticipated.  The above assessment and management plan was discussed with the patient. The patient verbalized understanding of and has agreed to the management plan. Patient is aware to call the clinic if symptoms persist or worsen. Patient is aware when to return to the clinic for a follow-up visit. Patient educated on when it is appropriate to go to the emergency department.   Time call ended:  9:11 Am  I provided 10 minutes of non-face-to-face time during this encounter.    Evelina Dun, FNP

## 2019-01-07 ENCOUNTER — Emergency Department (HOSPITAL_COMMUNITY): Payer: No Typology Code available for payment source

## 2019-01-07 ENCOUNTER — Emergency Department (HOSPITAL_COMMUNITY)
Admission: EM | Admit: 2019-01-07 | Discharge: 2019-01-07 | Disposition: A | Discharge status: Home / Self Care | Payer: No Typology Code available for payment source | Attending: Emergency Medicine | Admitting: Emergency Medicine

## 2019-01-07 ENCOUNTER — Other Ambulatory Visit: Payer: Self-pay

## 2019-01-07 ENCOUNTER — Encounter (HOSPITAL_COMMUNITY): Payer: Self-pay | Admitting: Emergency Medicine

## 2019-01-07 DIAGNOSIS — R0600 Dyspnea, unspecified: Secondary | ICD-10-CM | POA: Insufficient documentation

## 2019-01-07 DIAGNOSIS — Z79899 Other long term (current) drug therapy: Secondary | ICD-10-CM | POA: Insufficient documentation

## 2019-01-07 DIAGNOSIS — I10 Essential (primary) hypertension: Secondary | ICD-10-CM | POA: Diagnosis not present

## 2019-01-07 DIAGNOSIS — R0602 Shortness of breath: Secondary | ICD-10-CM | POA: Diagnosis present

## 2019-01-07 LAB — BASIC METABOLIC PANEL
Anion gap: 10 (ref 5–15)
BUN: 14 mg/dL (ref 6–20)
CO2: 24 mmol/L (ref 22–32)
Calcium: 9 mg/dL (ref 8.9–10.3)
Chloride: 106 mmol/L (ref 98–111)
Creatinine, Ser: 1.4 mg/dL — ABNORMAL HIGH (ref 0.61–1.24)
GFR calc Af Amer: >60 mL/min (ref >60)
GFR calc non Af Amer: 59 mL/min — ABNORMAL LOW (ref >60)
Glucose, Bld: 105 mg/dL — ABNORMAL HIGH (ref 70–99)
Potassium: 3.5 mmol/L (ref 3.5–5.1)
Sodium: 140 mmol/L (ref 135–145)

## 2019-01-07 LAB — CBC WITH DIFFERENTIAL/PLATELET
Abs Immature Granulocytes: 0.01 10*3/uL (ref 0.00–0.07)
Basophils Absolute: 0 10*3/uL (ref 0.0–0.1)
Basophils Relative: 1 %
Eosinophils Absolute: 0.1 10*3/uL (ref 0.0–0.5)
Eosinophils Relative: 1 %
HCT: 43.6 % (ref 39.0–52.0)
Hemoglobin: 14.3 g/dL (ref 13.0–17.0)
Immature Granulocytes: 0 %
Lymphocytes Relative: 41 %
Lymphs Abs: 1.7 10*3/uL (ref 0.7–4.0)
MCH: 26.4 pg (ref 26.0–34.0)
MCHC: 32.8 g/dL (ref 30.0–36.0)
MCV: 80.4 fL (ref 80.0–100.0)
Monocytes Absolute: 0.3 10*3/uL (ref 0.1–1.0)
Monocytes Relative: 7 %
Neutro Abs: 2.1 10*3/uL (ref 1.7–7.7)
Neutrophils Relative %: 50 %
Platelets: 241 10*3/uL (ref 150–400)
RBC: 5.42 MIL/uL (ref 4.22–5.81)
RDW: 14.5 % (ref 11.5–15.5)
WBC: 4.2 10*3/uL (ref 4.0–10.5)
nRBC: 0 % (ref 0.0–0.2)

## 2019-01-07 LAB — D-DIMER, QUANTITATIVE: D-Dimer, Quant: 0.28 ug/mL-FEU (ref 0.00–0.50)

## 2019-01-07 LAB — BRAIN NATRIURETIC PEPTIDE: B Natriuretic Peptide: 30 pg/mL (ref 0.0–100.0)

## 2019-01-07 LAB — TROPONIN I: Troponin I: <0.03 ng/mL (ref <0.03)

## 2019-01-07 NOTE — ED Triage Notes (Signed)
Pt reports he has been short of breath since Thursday morning.  Feels it is just hard to take a deep breath.  Is a truck driver and drives daily.  Father has history of blood clots.

## 2019-01-07 NOTE — Discharge Instructions (Signed)
You were seen in the emergency department for shortness of breath and concerns for possible blood clot.  You had a chest x-ray EKG and blood work that did not show an obvious cause of your symptoms.  There was no evidence of heart attack or blood clot.  Your blood pressure was elevated here and this will need to be followed with your primary care doctor.  Please return if any worsening symptoms.

## 2019-01-07 NOTE — ED Notes (Signed)
Pt ambulated well. O2 sats stayed in between 97 and 100%.

## 2019-01-07 NOTE — ED Notes (Signed)
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

## 2019-01-07 NOTE — ED Provider Notes (Signed)
[facility] Provider Note   CSN: [id number] Arrival date & time: 01/07/19  1232    History   Chief Complaint Chief Complaint  Patient presents with  . Shortness of Breath    HPI Stephen Schwartz is a 48 y.o. male.  He has a history of psoriasis.  He is complaining of upper respiratory symptoms for about a week with runny nose and allergies.  He said since Thursday he has been noticing intermittent shortness of breath.  It does not seem to occur with activity.  He just has to take a deeper breath than normal.  No chest pain.  Minimal cough nonproductive.  No fevers or chills.  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  He has a father had a history of blood clots.  No hemoptysis.     The history is provided by the patient.  Shortness of Breath  Severity:  Moderate Onset quality:  Gradual Timing:  Intermittent Progression:  Unchanged Chronicity:  New Relieved by:  None tried Worsened by:  Nothing Ineffective treatments:  None tried Associated symptoms: cough   Associated symptoms: no abdominal pain, no chest pain, no claudication, no diaphoresis, no fever, no headaches, no hemoptysis, no neck pain, no rash, no sore throat, no sputum production, no syncope, no vomiting and no wheezing   Risk factors: family hx of DVT   Risk factors: no hx of cancer, no hx of PE/DVT, no prolonged immobilization, no recent surgery and no tobacco use     Past Medical History:  Diagnosis Date  . History of nephrolithiasis   . Psoriasis   . Psoriasis     Patient Active Problem List   Diagnosis Date Noted  . Essential hypertension 03/31/2018  . Encounter for Department of Transportation (DOT) examination for driving license renewal 90/24/0973  . Psoriasis 12/08/2012    History reviewed. No pertinent surgical history.      Home Medications    Prior to Admission medications   Medication Sig Start Date End Date Taking? Authorizing Provider  diltiazem (CARDIZEM CD) 360 MG  24 hr capsule TAKE 1 CAPSULE (360 MG TOTAL) BY MOUTH AT BEDTIME. 04/19/18   Claretta Fraise, MD  fluticasone (FLONASE) 50 MCG/ACT nasal spray Place 2 sprays into both nostrils daily. 01/04/19   Sharion Balloon, FNP  Salicylic Acid 6 % SHAM USE AS DIRECTED Patient not taking: Reported on 01/04/2019 11/29/18   Janora Norlander, DO    Family History History reviewed. No pertinent family history.  Social History Social History   Tobacco Use  . Smoking status: Never Smoker  . Smokeless tobacco: Never Used  Substance Use Topics  . Alcohol use: No  . Drug use: No     Allergies   Patient has no known allergies.   Review of Systems Review of Systems  Constitutional: Negative for chills, diaphoresis and fever.  HENT: Negative for sore throat.   Eyes: Negative for visual disturbance.  Respiratory: Positive for cough and shortness of breath. Negative for hemoptysis, sputum production and wheezing.   Cardiovascular: Negative for chest pain, claudication, leg swelling and syncope.  Gastrointestinal: Negative for abdominal pain and vomiting.  Genitourinary: Negative for dysuria.  Musculoskeletal: Negative for neck pain.  Skin: Negative for rash.  Neurological: Negative for headaches.     Physical Exam Updated Vital Signs BP (!) 181/114 (BP Location: Right Arm)   Pulse 87   Temp 98.3 F (36.8 C) (Oral)   Resp 18   Ht  5\' 11"  (1.803 m)   Wt 101.2 kg   SpO2 99%   BMI 31.10 kg/m   Physical Exam Vitals signs and nursing note reviewed.  Constitutional:      General: He is not in acute distress.    Appearance: He is well-developed. He is not diaphoretic.  HENT:     Head: Normocephalic and atraumatic.  Eyes:     Conjunctiva/sclera: Conjunctivae normal.  Neck:     Musculoskeletal: Neck supple.  Cardiovascular:     Rate and Rhythm: Normal rate and regular rhythm.     Heart sounds: No murmur.  Pulmonary:     Effort: Pulmonary effort is normal. No respiratory distress.      Breath sounds: Normal breath sounds.  Abdominal:     Palpations: Abdomen is soft.     Tenderness: There is no abdominal tenderness.  Musculoskeletal: Normal range of motion.        General: No tenderness.     Right lower leg: He exhibits no tenderness. No edema.     Left lower leg: He exhibits no tenderness. No edema.  Skin:    General: Skin is warm and dry.     Capillary Refill: Capillary refill takes less than 2 seconds.  Neurological:     General: No focal deficit present.     Mental Status: He is alert.     Gait: Gait normal.      ED Treatments / Results  Labs (all labs ordered are listed, but only abnormal results are displayed) Labs Reviewed  BASIC METABOLIC PANEL - Abnormal; Notable for the following components:      Result Value   Glucose, Bld 105 (*)    Creatinine, Ser 1.40 (*)    GFR calc non Af Amer 59 (*)    All other components within normal limits  BRAIN NATRIURETIC PEPTIDE  TROPONIN I  CBC WITH DIFFERENTIAL/PLATELET  D-DIMER, QUANTITATIVE (NOT AT [facility])    EKG EKG Interpretation  Date/Time:  Sunday Jan 07 2019 12:53:23 EDT Ventricular Rate:  59 PR Interval:    QRS Duration: 120 QT Interval:  416 QTC Calculation: 413 R Axis:   33 Text Interpretation:  Sinus rhythm Nonspecific intraventricular conduction delay similar to prior 1/18 Confirmed by Aletta Edouard [phone]) on 01/07/2019 12:56:17 PM   Radiology Dg Chest 1 View  Result Date: 01/07/2019 CLINICAL DATA:  Shortness of breath EXAM: CHEST  1 VIEW COMPARISON:  None. FINDINGS: The heart size and mediastinal contours are within normal limits. Both lungs are clear. The visualized skeletal structures are unremarkable. IMPRESSION: No acute abnormality of the lungs in AP portable projection. No focal airspace opacity. Electronically Signed   By: Eddie Candle M.D.   On: 01/07/2019 13:09    Procedures Procedures (including critical care time)  Medications Ordered in ED Medications - No data to display    Initial Impression / Assessment and Plan / ED Course  I have reviewed the triage vital signs and the nursing notes.  Pertinent labs & imaging results that were available during my care of the patient were reviewed by me and considered in my medical decision making (see chart for details).  Clinical Course as of Jan 07 1908  Sun May 24, 176  [id number] 48 year old male here with 4 days of increased shortness of breath.  Does not really appear to be dyspnea with exertion.  He is hypertensive.  He says his doctors mention that in the past.  Sats are 99 to 100% on room air.  Afebrile.  Has a family history of DVT and is concerned.  Differential diagnosis includes PE, ACS, pneumonia, anemia, renal failure, Covid   [MB]  1407 Patient's dyspnea work-up is been unremarkable.  His creatinine is slightly elevated although it is ranged in this area in the past.  BMP troponin d-dimer and chest x-ray normal.  Will review this with the patient and likely discharge home.   [MB]  2505 Blood pressure is elevated here although patient states he did not take his medications yet.   [MB]  3976 Patient is trending pulse ox with sats between 97 and 100%.  He said he has no prescription coverage now that his insurance only really covers doctors visits.  He understands the need to manage his blood pressure and to continue to follow with his primary care doctor regarding this.  Instructions for return if any worsening symptoms.   [MB]    Clinical Course User Index [MB] Hayden Rasmussen, MD   Lydia Guiles was evaluated in Emergency Department on 01/07/2019 for the symptoms described in the history of present illness. He was evaluated in the context of the global COVID-19 pandemic, which necessitated consideration that the patient might be at risk for infection with the SARS-CoV-2 virus that causes COVID-19. Institutional protocols and algorithms that pertain to the evaluation of patients at risk for COVID-19 are in a state  of rapid change based on information released by regulatory bodies including the CDC and federal and state organizations. These policies and algorithms were followed during the patient's care in the ED.      Final Clinical Impressions(s) / ED Diagnoses   Final diagnoses:  Dyspnea, unspecified type  Hypertension, unspecified type    ED Discharge Orders    None       Hayden Rasmussen, MD 01/07/19 1910

## 2019-07-20 ENCOUNTER — Ambulatory Visit (INDEPENDENT_AMBULATORY_CARE_PROVIDER_SITE_OTHER): Payer: No Typology Code available for payment source | Admitting: Family Medicine

## 2019-07-20 ENCOUNTER — Ambulatory Visit: Payer: Self-pay | Admitting: Family Medicine

## 2019-07-20 ENCOUNTER — Other Ambulatory Visit: Payer: Self-pay

## 2019-07-20 ENCOUNTER — Encounter: Payer: Self-pay | Admitting: Family Medicine

## 2019-07-20 DIAGNOSIS — I10 Essential (primary) hypertension: Secondary | ICD-10-CM

## 2019-07-20 MED ORDER — LISINOPRIL 10 MG PO TABS: 10.0000 mg | ORAL_TABLET | Freq: Every day | ORAL | 3 refills | Status: DC

## 2019-07-20 MED ORDER — DILTIAZEM HCL ER COATED BEADS 360 MG PO CP24: 360.0000 mg | ORAL_CAPSULE | Freq: Every day | ORAL | 1 refills | Status: DC

## 2019-07-20 NOTE — Progress Notes (Signed)
Subjective:    Patient ID: Stephen Schwartz, male    DOB: 1971-08-09, 48 y.o.   MRN: [id number]   HPI: Stephen Schwartz is a 48 y.o. male presenting for  presents for  follow-up of hypertension. Patient has no history of headache chest pain or shortness of breath or recent cough. Patient also denies symptoms of TIA such as focal numbness or weakness. Patient denies side effects from medication. States taking it regularly.BP high when he checks it at [facility]. Systolic in the Q000111Q.   Some urinary frequency. Nocturia. Started while off of med. Better now since he got back on it.     Depression screen [facility] 2/9 01/26/2018 11/15/2017 10/11/2017 07/27/2016 04/01/2016  Decreased Interest 0 0 0 0 0  Down, Depressed, Hopeless 0 0 0 0 0  PHQ - 2 Score 0 0 0 0 0  Altered sleeping - - - - -  Tired, decreased energy - - - - -  Change in appetite - - - - -  Feeling bad or failure about yourself  - - - - -  Trouble concentrating - - - - -  Moving slowly or fidgety/restless - - - - -  Suicidal thoughts - - - - -  PHQ-9 Score - - - - -     Relevant past medical, surgical, family and social history reviewed and updated as indicated.  Interim medical history since our last visit reviewed. Allergies and medications reviewed and updated.  ROS:  Review of Systems  Constitutional: Negative for fever.  HENT: Negative.   Respiratory: Negative for shortness of breath.   Cardiovascular: Negative for chest pain.  Gastrointestinal: Negative for abdominal pain.  Genitourinary: Positive for frequency. Negative for dysuria, hematuria and urgency.  Musculoskeletal: Negative for arthralgias.  Skin: Negative for rash.     Social History   Tobacco Use  Smoking Status Never Smoker  Smokeless Tobacco Never Used       Objective:     Wt Readings from Last 3 Encounters:  01/07/19 223 lb (101.2 kg)  03/31/18 219 lb 4 oz (99.5 kg)  01/26/18 219 lb 6 oz (99.5 kg)     Exam deferred. Pt. Harboring due to COVID  19. Phone visit performed.   Assessment & Plan:   1. Essential hypertension     Meds ordered this encounter  Medications  . diltiazem (CARDIZEM CD) 360 MG 24 hr capsule    Sig: Take 1 capsule (360 mg total) by mouth at bedtime.    Dispense:  90 capsule    Refill:  1  . lisinopril (ZESTRIL) 10 MG tablet    Sig: Take 1 tablet (10 mg total) by mouth daily.    Dispense:  90 tablet    Refill:  3    No orders of the defined types were placed in this encounter.     Diagnoses and all orders for this visit:  Essential hypertension -     lisinopril (ZESTRIL) 10 MG tablet; Take 1 tablet (10 mg total) by mouth daily.  Other orders -     diltiazem (CARDIZEM CD) 360 MG 24 hr capsule; Take 1 capsule (360 mg total) by mouth at bedtime.    Virtual Visit via telephone Note  I discussed the limitations, risks, security and privacy concerns of performing an evaluation and management service by telephone and the availability of in person appointments. The patient was identified with two identifiers. Pt.expressed understanding and agreed to proceed. Pt. Is at home.  Dr. Livia Snellen is in his office.  Follow Up Instructions:   I discussed the assessment and treatment plan with the patient. The patient was provided an opportunity to ask questions and all were answered. The patient agreed with the plan and demonstrated an understanding of the instructions.   The patient was advised to call back or seek an in-person evaluation if the symptoms worsen or if the condition fails to improve as anticipated.   Total minutes including chart review and phone contact time: 18   Follow up plan: Return in about 6 months (around 01/18/2020) for hypertension.  Claretta Fraise, MD [place]

## 2019-11-01 ENCOUNTER — Encounter: Payer: Self-pay | Admitting: Family Medicine

## 2019-11-01 ENCOUNTER — Other Ambulatory Visit: Payer: Self-pay

## 2019-11-01 ENCOUNTER — Ambulatory Visit: Payer: No Typology Code available for payment source | Admitting: Family Medicine

## 2019-11-01 VITALS — BP 179/101 | HR 75 | Temp 98.6°F | Ht 71.0 in | Wt 229.6 lb

## 2019-11-01 DIAGNOSIS — N410 Acute prostatitis: Secondary | ICD-10-CM

## 2019-11-01 DIAGNOSIS — R350 Frequency of micturition: Secondary | ICD-10-CM | POA: Diagnosis not present

## 2019-11-01 LAB — URINALYSIS
Bilirubin, UA: NEGATIVE
Glucose, UA: NEGATIVE
Ketones, UA: NEGATIVE
Leukocytes,UA: NEGATIVE
Nitrite, UA: NEGATIVE
Protein,UA: NEGATIVE
Specific Gravity, UA: 1.025 (ref 1.005–1.030)
Urobilinogen, Ur: 0.2 mg/dL (ref 0.2–1.0)
pH, UA: 6.5 (ref 5.0–7.5)

## 2019-11-01 MED ORDER — DOXYCYCLINE HYCLATE 100 MG PO CAPS: 100.0000 mg | ORAL_CAPSULE | Freq: Two times a day (BID) | ORAL | 0 refills | Status: DC

## 2019-11-01 NOTE — Progress Notes (Signed)
Subjective:  Patient ID: Stephen Schwartz, male    DOB: 09-06-1970  Age: 49 y.o. MRN: [id number]  CC: Dysuria (increased frequency x 3 months)   HPI Stephen Schwartz presents for urinary frequncy without dysuria for three months. No fever. No polyuria. Has polyphagia - constantly eating while driving. No change in urine flow regarding force of flow.  No Hesitancy. No nocturia or fever.  He and his wife have a monogamous relationship he says.  No risk for STD.  Depression screen [facility] 2/9 11/01/2019 01/26/2018 11/15/2017  Decreased Interest 0 0 0  Down, Depressed, Hopeless 0 0 0  PHQ - 2 Score 0 0 0  Altered sleeping - - -  Tired, decreased energy - - -  Change in appetite - - -  Feeling bad or failure about yourself  - - -  Trouble concentrating - - -  Moving slowly or fidgety/restless - - -  Suicidal thoughts - - -  PHQ-9 Score - - -    History Stephen Schwartz has a past medical history of History of nephrolithiasis, Psoriasis, and Psoriasis.   He has no past surgical history on file.   His family history is not on file.He reports that he has never smoked. He has never used smokeless tobacco. He reports that he does not drink alcohol or use drugs.    ROS Review of Systems  Constitutional: Negative for fever.  Respiratory: Negative for shortness of breath.   Cardiovascular: Negative for chest pain.  Genitourinary: Positive for frequency. Negative for dysuria.  Musculoskeletal: Negative for arthralgias.  Skin: Negative for rash.    Objective:  BP (!) 179/101   Pulse 75   Temp 98.6 F (37 C)   Ht '5\' 11"'  (1.803 m)   Wt 229 lb 9.6 oz (104.1 kg)   SpO2 98%   BMI 32.02 kg/m   BP Readings from Last 3 Encounters:  11/01/19 (!) 179/101  01/07/19 (!) 155/89  03/31/18 133/84    Wt Readings from Last 3 Encounters:  11/01/19 229 lb 9.6 oz (104.1 kg)  01/07/19 223 lb (101.2 kg)  03/31/18 219 lb 4 oz (99.5 kg)     Physical Exam    Assessment & Plan:   Stephen Schwartz was seen today for  dysuria.  Diagnoses and all orders for this visit:  Acute prostatitis -     Urinalysis -     CBC with Differential/Platelet -     CMP14+EGFR -     PSA Total (Reflex To Free) -     Urine Culture -     doxycycline (VIBRAMYCIN) 100 MG capsule; Take 1 capsule (100 mg total) by mouth 2 (two) times daily.  Urinary frequency -     Urinalysis -     CBC with Differential/Platelet -     CMP14+EGFR -     PSA Total (Reflex To Free) -     Urine Culture -     doxycycline (VIBRAMYCIN) 100 MG capsule; Take 1 capsule (100 mg total) by mouth 2 (two) times daily.       I am having Stephen Schwartz start on doxycycline. I am also having him maintain his Salicylic Acid, fluticasone, acetaminophen, naproxen sodium, diltiazem, and lisinopril.  Allergies as of 11/01/2019   No Known Allergies     Medication List       Accurate as of November 01, 2019 11:59 PM. If you have any questions, ask your nurse or doctor.  acetaminophen 500 MG tablet Commonly known as: TYLENOL Take 500 mg by mouth every 6 (six) hours as needed for moderate pain.   diltiazem 360 MG 24 hr capsule Commonly known as: CARDIZEM CD Take 1 capsule (360 mg total) by mouth at bedtime.   doxycycline 100 MG capsule Commonly known as: VIBRAMYCIN Take 1 capsule (100 mg total) by mouth 2 (two) times daily. Started by: Claretta Fraise, MD   fluticasone 50 MCG/ACT nasal spray Commonly known as: FLONASE Place 2 sprays into both nostrils daily.   lisinopril 10 MG tablet Commonly known as: ZESTRIL Take 1 tablet (10 mg total) by mouth daily.   naproxen sodium 220 MG tablet Commonly known as: ALEVE Take 220 mg by mouth daily as needed (pain).   Salicylic Acid 6 % Sham USE AS DIRECTED        Follow-up: Return in about 1 month (around 12/02/2019), or if symptoms worsen or fail to improve.  Claretta Fraise, M.D.

## 2019-11-02 ENCOUNTER — Encounter: Payer: Self-pay | Admitting: Family Medicine

## 2019-11-02 LAB — PSA TOTAL (REFLEX TO FREE): Prostate Specific Ag, Serum: 3.4 ng/mL (ref 0.0–4.0)

## 2019-11-02 LAB — CBC WITH DIFFERENTIAL/PLATELET
Basophils Absolute: 0 10*3/uL (ref 0.0–0.2)
Basos: 1 %
EOS (ABSOLUTE): 0.1 10*3/uL (ref 0.0–0.4)
Eos: 2 %
Hematocrit: 44.1 % (ref 37.5–51.0)
Hemoglobin: 14.5 g/dL (ref 13.0–17.7)
Immature Grans (Abs): 0 10*3/uL (ref 0.0–0.1)
Immature Granulocytes: 0 %
Lymphocytes Absolute: 2.1 10*3/uL (ref 0.7–3.1)
Lymphs: 45 %
MCH: 26 pg — ABNORMAL LOW (ref 26.6–33.0)
MCHC: 32.9 g/dL (ref 31.5–35.7)
MCV: 79 fL (ref 79–97)
Monocytes Absolute: 0.3 10*3/uL (ref 0.1–0.9)
Monocytes: 6 %
Neutrophils Absolute: 2.2 10*3/uL (ref 1.4–7.0)
Neutrophils: 46 %
Platelets: 270 10*3/uL (ref 150–450)
RBC: 5.58 x10E6/uL (ref 4.14–5.80)
RDW: 14.2 % (ref 11.6–15.4)
WBC: 4.7 10*3/uL (ref 3.4–10.8)

## 2019-11-02 LAB — CMP14+EGFR
ALT: 30 IU/L (ref 0–44)
AST: 26 IU/L (ref 0–40)
Albumin/Globulin Ratio: 1.8 (ref 1.2–2.2)
Albumin: 4.5 g/dL (ref 4.0–5.0)
Alkaline Phosphatase: 79 IU/L (ref 39–117)
BUN/Creatinine Ratio: 8 — ABNORMAL LOW (ref 9–20)
BUN: 11 mg/dL (ref 6–24)
Bilirubin Total: 1.3 mg/dL — ABNORMAL HIGH (ref 0.0–1.2)
CO2: 23 mmol/L (ref 20–29)
Calcium: 8.9 mg/dL (ref 8.7–10.2)
Chloride: 103 mmol/L (ref 96–106)
Creatinine, Ser: 1.41 mg/dL — ABNORMAL HIGH (ref 0.76–1.27)
GFR calc Af Amer: 68 mL/min/{1.73_m2} (ref >59)
GFR calc non Af Amer: 58 mL/min/{1.73_m2} — ABNORMAL LOW (ref >59)
Globulin, Total: 2.5 g/dL (ref 1.5–4.5)
Glucose: 110 mg/dL — ABNORMAL HIGH (ref 65–99)
Potassium: 4.3 mmol/L (ref 3.5–5.2)
Sodium: 142 mmol/L (ref 134–144)
Total Protein: 7 g/dL (ref 6.0–8.5)

## 2019-11-02 NOTE — Progress Notes (Signed)
Hello Argie,  Your lab result is normal and/or stable.Some minor variations that are not significant are commonly marked abnormal, but do not represent any medical problem for you.  Best regards, Julane Crock, M.D.

## 2019-11-03 LAB — URINE CULTURE

## 2019-12-24 ENCOUNTER — Emergency Department (HOSPITAL_COMMUNITY)
Admission: EM | Admit: 2019-12-24 | Discharge: 2019-12-24 | Disposition: A | Discharge status: Home / Self Care | Payer: BC Managed Care – PPO | Attending: Emergency Medicine | Admitting: Emergency Medicine

## 2019-12-24 ENCOUNTER — Other Ambulatory Visit: Payer: Self-pay

## 2019-12-24 ENCOUNTER — Encounter (HOSPITAL_COMMUNITY): Payer: Self-pay | Admitting: *Deleted

## 2019-12-24 DIAGNOSIS — M25552 Pain in left hip: Secondary | ICD-10-CM | POA: Diagnosis not present

## 2019-12-24 DIAGNOSIS — Z79899 Other long term (current) drug therapy: Secondary | ICD-10-CM | POA: Insufficient documentation

## 2019-12-24 DIAGNOSIS — I10 Essential (primary) hypertension: Secondary | ICD-10-CM | POA: Insufficient documentation

## 2019-12-24 HISTORY — DX: Pain in unspecified hip: M25.559

## 2019-12-24 MED ORDER — DEXAMETHASONE SODIUM PHOSPHATE 10 MG/ML IJ SOLN
10.0000 mg | Freq: Once | INTRAMUSCULAR | Status: AC
  Administered 2019-12-24: 19:00:00 10 mg via INTRAMUSCULAR
  Filled 2019-12-24: qty 1

## 2019-12-24 NOTE — ED Triage Notes (Signed)
Chronic left hip pain, states he was getting injections in his hip but due to cost he can no longer afford them.

## 2019-12-24 NOTE — ED Provider Notes (Signed)
[facility] Provider Note   CSN: [id number] Arrival date & time: 12/24/19  1354     History Chief Complaint  Patient presents with  . Hip Pain    Stephen Schwartz is a 49 y.o. male.  HPI     Stephen Schwartz is a 49 y.o. male, with a history of chronic left hip pain, HTN, presenting to the ED with chronic left hip pain.  States he has had pain in the left hip for several years.  He was previously receiving periodic injections in the hip through the orthopedic specialist, however, his insurance stopped covering the injections and he cannot afford to pay for them out of pocket.  He thinks his last injection was January 2021. Pain is aching, moderate to severe, nonradiating.  He states he has been taking Aleve daily for several months. He was seen yesterday at urgent care and prescribed a 6-day taper of prednisone and a muscle relaxer, but states his pain is no better.  Denies fever/chills, falls/trauma, abdominal pain, groin pain, changes in bowel or bladder function, saddle anesthesias, back pain, numbness, weakness, leg swelling, or any other complaints.   Past Medical History:  Diagnosis Date  . Hip pain   . History of nephrolithiasis   . Psoriasis   . Psoriasis     Patient Active Problem List   Diagnosis Date Noted  . Essential hypertension 03/31/2018  . Encounter for Department of Transportation (DOT) examination for driving license renewal [id number]  . Psoriasis 12/08/2012    History reviewed. No pertinent surgical history.     No family history on file.  Social History   Tobacco Use  . Smoking status: Never Smoker  . Smokeless tobacco: Never Used  Substance Use Topics  . Alcohol use: No  . Drug use: No    Home Medications Prior to Admission medications   Medication Sig Start Date End Date Taking? Authorizing Provider  acetaminophen (TYLENOL) 500 MG tablet Take 500 mg by mouth every 6 (six) hours as needed for moderate pain.     [provider]  diltiazem (CARDIZEM CD) 360 MG 24 hr capsule Take 1 capsule (360 mg total) by mouth at bedtime. Patient not taking: Reported on 11/01/2019 07/20/19   Claretta Fraise, MD  doxycycline (VIBRAMYCIN) 100 MG capsule Take 1 capsule (100 mg total) by mouth 2 (two) times daily. 11/01/19   Claretta Fraise, MD  fluticasone (FLONASE) 50 MCG/ACT nasal spray Place 2 sprays into both nostrils daily. 01/04/19   Evelina Dun A, FNP  lisinopril (ZESTRIL) 10 MG tablet Take 1 tablet (10 mg total) by mouth daily. 07/20/19   Claretta Fraise, MD  naproxen sodium (ALEVE) 220 MG tablet Take 220 mg by mouth daily as needed (pain).    [provider]  Salicylic Acid 6 % SHAM USE AS DIRECTED 11/29/18   Janora Norlander, DO    Allergies    Patient has no known allergies.  Review of Systems   Review of Systems  Constitutional: Negative for chills and fever.  Respiratory: Negative for shortness of breath.   Cardiovascular: Negative for chest pain and leg swelling.  Gastrointestinal: Negative for abdominal pain.  Genitourinary: Negative for difficulty urinating, scrotal swelling and testicular pain.  Musculoskeletal: Positive for arthralgias. Negative for back pain.  Neurological: Negative for weakness and numbness.    Physical Exam Updated Vital Signs BP (!) 177/106   Pulse 66   Temp 98.1 F (36.7 C)   Resp 20  SpO2 95%   Physical Exam Vitals and nursing note reviewed.  Constitutional:      General: He is not in acute distress.    Appearance: He is well-developed. He is not diaphoretic.  HENT:     Head: Normocephalic and atraumatic.  Eyes:     Conjunctiva/sclera: Conjunctivae normal.  Cardiovascular:     Rate and Rhythm: Normal rate and regular rhythm.     Pulses:          Dorsalis pedis pulses are 2+ on the right side and 2+ on the left side.       Posterior tibial pulses are 2+ on the right side and 2+ on the left side.  Pulmonary:     Effort: Pulmonary effort is  normal.  Abdominal:     Palpations: Abdomen is soft.     Tenderness: There is no abdominal tenderness.  Musculoskeletal:     Cervical back: Neck supple.     Right lower leg: No edema.     Left lower leg: No edema.     Comments: Normal motor function intact in all extremities. No midline spinal tenderness.  No noted tenderness throughout the back.  No area of point tenderness on palpation of the left hip.  Patient does have some pain with range of motion of the left hip, though range of motion is intact. No noted swelling, deformity, or instability. No pain with range of motion of the left knee.  Skin:    General: Skin is warm and dry.     Coloration: Skin is not pale.  Neurological:     Mental Status: He is alert.     Comments: Sensation grossly intact to light touch in the lower extremities bilaterally. No saddle anesthesias. Strength 5/5 in the bilateral lower extremities. No noted gait deficit. Coordination intact.  Psychiatric:        Behavior: Behavior normal.     ED Results / Procedures / Treatments   Labs (all labs ordered are listed, but only abnormal results are displayed) Labs Reviewed - No data to display  EKG None  Radiology No results found.  Procedures Procedures (including critical care time)  Medications Ordered in ED Medications  dexamethasone (DECADRON) injection 10 mg (10 mg Intramuscular Given 12/24/19 1831)    ED Course  I have reviewed the triage vital signs and the nursing notes.  Pertinent labs & imaging results that were available during my care of the patient were reviewed by me and considered in my medical decision making (see chart for details).    MDM Rules/Calculators/A&P                      Patient presents for chronic hip pain.  He states he can no longer afford to go to his orthopedic specialist. I spent a great deal of time discussing options with the patient, but it seems as though he has either tried these or has been  recently prescribed the suggested medications. I do think he may benefit from physical therapy.  I advised him he would have to go through his PCP or orthopedic specialist for this. The patient was given instructions for home care as well as return precautions. Patient voices understanding of these instructions, accepts the plan, and is comfortable with discharge.   Final Clinical Impression(s) / ED Diagnoses Final diagnoses:  Left hip pain    Rx / DC Orders ED Discharge Orders    None  Lorayne Bender, PA-C 12/25/19 0037    Hayden Rasmussen, MD 12/25/19 1119

## 2019-12-24 NOTE — Discharge Instructions (Addendum)
  Acetaminophen: May take acetaminophen (generic for Tylenol), as needed, for pain. Your daily total maximum amount of acetaminophen from all sources should be limited to 4000mg /day for persons without liver problems, or 2000mg /day for those with liver problems. Prednisone: Continue to take the prednisone, as prescribed. Ice: May apply ice to the area over the next 24 hours for 15 minutes at a time to reduce pain, inflammation, and swelling, if present. Exercises: Be sure to perform the attached exercises starting with three times a week and working up to performing them daily. This is an essential part of preventing long term problems.  Follow up: Follow up with a primary care provider for any future management of these complaints. Be sure to follow up within 7-10 days.  I suspect you could benefit from formal physical therapy. Return: Return to the ED should symptoms worsen.  For prescription assistance, may try using prescription discount sites or apps, such as goodrx.com  Your blood pressure was higher than normal today.  Please be sure to take your blood pressure medications every day.

## 2020-01-08 ENCOUNTER — Encounter: Payer: Self-pay | Admitting: Family Medicine

## 2020-01-08 ENCOUNTER — Ambulatory Visit (INDEPENDENT_AMBULATORY_CARE_PROVIDER_SITE_OTHER): Payer: No Typology Code available for payment source | Admitting: Family Medicine

## 2020-01-08 DIAGNOSIS — M25552 Pain in left hip: Secondary | ICD-10-CM | POA: Diagnosis not present

## 2020-01-08 DIAGNOSIS — I10 Essential (primary) hypertension: Secondary | ICD-10-CM

## 2020-01-08 MED ORDER — LISINOPRIL 20 MG PO TABS: 20.0000 mg | ORAL_TABLET | Freq: Every day | ORAL | 1 refills | Status: DC

## 2020-01-08 NOTE — Progress Notes (Signed)
Subjective:    Patient ID: Stephen Schwartz, male    DOB: [birth year], 49 y.o.   MRN: [id number]   HPI: Stephen Schwartz is a 49 y.o. male presenting for hip issue for 3-4 years. Therapy, shots, prednisone, muscle relaxer have quit helping. Had an MRI at [facility]. They declined to do surgery  Went to Urgent care and was told BP is still running too high. Requests increse of dose of his med  Depression screen [facility] 2/9 11/01/2019 01/26/2018 11/15/2017 10/11/2017 07/27/2016  Decreased Interest 0 0 0 0 0  Down, Depressed, Hopeless 0 0 0 0 0  PHQ - 2 Score 0 0 0 0 0  Altered sleeping - - - - -  Tired, decreased energy - - - - -  Change in appetite - - - - -  Feeling bad or failure about yourself  - - - - -  Trouble concentrating - - - - -  Moving slowly or fidgety/restless - - - - -  Suicidal thoughts - - - - -  PHQ-9 Score - - - - -     Relevant past medical, surgical, family and social history reviewed and updated as indicated.  Interim medical history since our last visit reviewed. Allergies and medications reviewed and updated.  ROS:  Review of Systems  Constitutional: Negative for fever.  Respiratory: Negative for shortness of breath.   Cardiovascular: Negative for chest pain.  Musculoskeletal: Positive for arthralgias.  Skin: Negative for rash.     Social History   Tobacco Use  Smoking Status Never Smoker  Smokeless Tobacco Never Used       Objective:     Wt Readings from Last 3 Encounters:  11/01/19 229 lb 9.6 oz (104.1 kg)  01/07/19 223 lb (101.2 kg)  03/31/18 219 lb 4 oz (99.5 kg)     Exam deferred. Pt. Harboring due to COVID 19. Phone visit performed.   Assessment & Plan:   1. Left hip pain   2. Essential hypertension     Meds ordered this encounter  Medications  . lisinopril (ZESTRIL) 20 MG tablet    Sig: Take 1 tablet (20 mg total) by mouth daily.    Dispense:  90 tablet    Refill:  1    Orders Placed This Encounter  Procedures  . Ambulatory  referral to Orthopedics    Referral Priority:   Routine    Referral Type:   Consultation    Number of Visits Requested:   1      Diagnoses and all orders for this visit:  Left hip pain -     Ambulatory referral to Orthopedics  Essential hypertension -     lisinopril (ZESTRIL) 20 MG tablet; Take 1 tablet (20 mg total) by mouth daily.    Virtual Visit via telephone Note  I discussed the limitations, risks, security and privacy concerns of performing an evaluation and management service by telephone and the availability of in person appointments. The patient was identified with two identifiers. Pt.expressed understanding and agreed to proceed. Pt. Is at home. Dr. Livia Snellen is in his office.  Follow Up Instructions:   I discussed the assessment and treatment plan with the patient. The patient was provided an opportunity to ask questions and all were answered. The patient agreed with the plan and demonstrated an understanding of the instructions.   The patient was advised to call back or seek an in-person evaluation if the symptoms worsen or if the  condition fails to improve as anticipated.   Total minutes including chart review and phone contact time: 24   Follow up plan: Return in about 6 weeks (around 02/19/2020) for hypertension.  Claretta Fraise, MD [place]

## 2020-01-18 ENCOUNTER — Ambulatory Visit: Payer: BC Managed Care – PPO | Admitting: Orthopaedic Surgery

## 2020-01-25 ENCOUNTER — Encounter: Payer: Self-pay | Admitting: Orthopaedic Surgery

## 2020-01-25 ENCOUNTER — Ambulatory Visit: Payer: Self-pay

## 2020-01-25 ENCOUNTER — Ambulatory Visit: Payer: BC Managed Care – PPO | Admitting: Orthopaedic Surgery

## 2020-01-25 DIAGNOSIS — M1612 Unilateral primary osteoarthritis, left hip: Secondary | ICD-10-CM | POA: Diagnosis not present

## 2020-01-25 NOTE — Progress Notes (Signed)
Office Visit Note   Patient: Stephen Schwartz           Date of Birth: 1971/04/08           MRN: [id number] Visit Date: 01/25/2020              Requested by: Claretta Fraise, MD [place],  [place] [id number] PCP: Claretta Fraise, MD   Assessment & Plan: Visit Diagnoses:  1. Primary osteoarthritis of left hip     Plan: Impression is left hip DJD.  At this point he is still responding to cortisone injections.  He feels that he would like to continue to try cortisone injections.  He will make an appointment to see Dr. Junius Roads for this in the near future.  He understands that the likelihood of needing left hip replacement in the future is quite high given the amount of DJD that he has at his age.  We will see him back as needed.  Follow-Up Instructions: Return for needs appt to see [facility] for left hip injection.   Orders:  Orders Placed This Encounter  Procedures  . XR HIP UNILAT W OR W/O PELVIS 2-3 VIEWS LEFT   No orders of the defined types were placed in this encounter.     Procedures: No procedures performed   Clinical Data: No additional findings.   Subjective: Chief Complaint  Patient presents with  . Left Hip - Pain    Stephen Schwartz is a 49 year old male here for evaluation of chronic left hip pain for 4 years.  He has deep hip pain as well as groin pain with locking sensation and giving way.  He has had cortisone injections in the past with temporary relief.  He was going to [facility] but he decided to switch his care to [place].  He feels like the pain is fairly significant at this point.  It is quite functionally limiting.   Review of Systems  Constitutional: Negative.   All other systems reviewed and are negative.    Objective: Vital Signs: There were no vitals taken for this visit.  Physical Exam Vitals and nursing note reviewed.  Constitutional:      Appearance: He is well-developed.  HENT:     Head: Normocephalic and atraumatic.  Eyes:     Pupils: Pupils  are equal, round, and reactive to light.  Pulmonary:     Effort: Pulmonary effort is normal.  Abdominal:     Palpations: Abdomen is soft.  Musculoskeletal:        General: Normal range of motion.     Cervical back: Neck supple.  Skin:    General: Skin is warm.  Neurological:     Mental Status: He is alert and oriented to person, place, and time.  Psychiatric:        Behavior: Behavior normal.        Thought Content: Thought content normal.        Judgment: Judgment normal.     Ortho Exam Left hip shows positive FADIR and positive Stinchfield.  Lateral hip is mildly tender.  Negative straight leg sign. Specialty Comments:  No specialty comments available.  Imaging: XR HIP UNILAT W OR W/O PELVIS 2-3 VIEWS LEFT  Result Date: 01/25/2020 Moderate left hip DJD    PMFS History: Patient Active Problem List   Diagnosis Date Noted  . Primary osteoarthritis of left hip 01/25/2020  . Essential hypertension 03/31/2018  . 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 Psoriasis 12/08/2012   Past Medical History:  Diagnosis Date  . Hip pain   . History of nephrolithiasis   . Psoriasis   . Psoriasis     History reviewed. No pertinent family history.  History reviewed. No pertinent surgical history. Social History   Occupational History  . Not on file  Tobacco Use  . Smoking status: Never Smoker  . Smokeless tobacco: Never Used  Substance and Sexual Activity  . Alcohol use: No  . Drug use: No  . Sexual activity: Not on file

## 2020-02-29 ENCOUNTER — Ambulatory Visit: Payer: BC Managed Care – PPO | Admitting: Family Medicine

## 2020-03-03 ENCOUNTER — Ambulatory Visit: Payer: No Typology Code available for payment source | Admitting: Family Medicine

## 2020-03-04 ENCOUNTER — Encounter: Payer: Self-pay | Admitting: Family Medicine

## 2020-04-30 ENCOUNTER — Encounter: Payer: Self-pay | Admitting: Family Medicine

## 2020-04-30 ENCOUNTER — Ambulatory Visit (INDEPENDENT_AMBULATORY_CARE_PROVIDER_SITE_OTHER): Payer: No Typology Code available for payment source | Admitting: Family Medicine

## 2020-04-30 ENCOUNTER — Other Ambulatory Visit: Payer: Self-pay

## 2020-04-30 VITALS — BP 166/102 | HR 64 | Temp 97.9°F | Ht 71.0 in | Wt 227.2 lb

## 2020-04-30 DIAGNOSIS — R3989 Other symptoms and signs involving the genitourinary system: Secondary | ICD-10-CM | POA: Diagnosis not present

## 2020-04-30 DIAGNOSIS — M7918 Myalgia, other site: Secondary | ICD-10-CM | POA: Diagnosis not present

## 2020-04-30 LAB — URINALYSIS, COMPLETE
Bilirubin, UA: NEGATIVE
Glucose, UA: NEGATIVE
Ketones, UA: NEGATIVE
Leukocytes,UA: NEGATIVE
Nitrite, UA: NEGATIVE
Protein,UA: NEGATIVE
Specific Gravity, UA: 1.02 (ref 1.005–1.030)
Urobilinogen, Ur: 2 mg/dL — ABNORMAL HIGH (ref 0.2–1.0)
pH, UA: 7.5 (ref 5.0–7.5)

## 2020-04-30 LAB — MICROSCOPIC EXAMINATION
Bacteria, UA: NONE SEEN
Epithelial Cells (non renal): NONE SEEN /hpf (ref 0–10)

## 2020-04-30 MED ORDER — TIZANIDINE HCL 4 MG PO TABS: 4.0000 mg | ORAL_TABLET | Freq: Three times a day (TID) | ORAL | 0 refills | Status: DC | PRN

## 2020-04-30 NOTE — Patient Instructions (Addendum)
Muscle rub Heating pad Muscle relaxer NSAID - such as your Naproxen/Aleve Dry needling with physical therapist   Low Back Sprain or Strain Rehab Ask your health care provider which exercises are safe for you. Do exercises exactly as told by your health care provider and adjust them as directed. It is normal to feel mild stretching, pulling, tightness, or discomfort as you do these exercises. Stop right away if you feel sudden pain or your pain gets worse. Do not begin these exercises until told by your health care provider. Stretching and range-of-motion exercises These exercises warm up your muscles and joints and improve the movement and flexibility of your back. These exercises also help to relieve pain, numbness, and tingling. Lumbar rotation  1. Lie on your back on a firm surface and bend your knees. 2. Straighten your arms out to your sides so each arm forms a 90-degree angle (right angle) with a side of your body. 3. Slowly move (rotate) both of your knees to one side of your body until you feel a stretch in your lower back (lumbar). Try not to let your shoulders lift off the floor. 4. Hold this position for __________ seconds. 5. Tense your abdominal muscles and slowly move your knees back to the starting position. 6. Repeat this exercise on the other side of your body. Repeat __________ times. Complete this exercise __________ times a day. Single knee to chest  1. Lie on your back on a firm surface with both legs straight. 2. Bend one of your knees. Use your hands to move your knee up toward your chest until you feel a gentle stretch in your lower back and buttock. ? Hold your leg in this position by holding on to the front of your knee. ? Keep your other leg as straight as possible. 3. Hold this position for __________ seconds. 4. Slowly return to the starting position. 5. Repeat with your other leg. Repeat __________ times. Complete this exercise __________ times a day. Prone  extension on elbows  1. Lie on your abdomen on a firm surface (prone position). 2. Prop yourself up on your elbows. 3. Use your arms to help lift your chest up until you feel a gentle stretch in your abdomen and your lower back. ? This will place some of your body weight on your elbows. If this is uncomfortable, try stacking pillows under your chest. ? Your hips should stay down, against the surface that you are lying on. Keep your hip and back muscles relaxed. 4. Hold this position for __________ seconds. 5. Slowly relax your upper body and return to the starting position. Repeat __________ times. Complete this exercise __________ times a day. Strengthening exercises These exercises build strength and endurance in your back. Endurance is the ability to use your muscles for a long time, even after they get tired. Pelvic tilt This exercise strengthens the muscles that lie deep in the abdomen. 1. Lie on your back on a firm surface. Bend your knees and keep your feet flat on the floor. 2. Tense your abdominal muscles. Tip your pelvis up toward the ceiling and flatten your lower back into the floor. ? To help with this exercise, you may place a small towel under your lower back and try to push your back into the towel. 3. Hold this position for __________ seconds. 4. Let your muscles relax completely before you repeat this exercise. Repeat __________ times. Complete this exercise __________ times a day. Alternating arm and leg raises  1. Get on your hands and knees on a firm surface. If you are on a hard floor, you may want to use padding, such as an exercise mat, to cushion your knees. 2. Line up your arms and legs. Your hands should be directly below your shoulders, and your knees should be directly below your hips. 3. Lift your left leg behind you. At the same time, raise your right arm and straighten it in front of you. ? Do not lift your leg higher than your hip. ? Do not lift your arm  higher than your shoulder. ? Keep your abdominal and back muscles tight. ? Keep your hips facing the ground. ? Do not arch your back. ? Keep your balance carefully, and do not hold your breath. 4. Hold this position for __________ seconds. 5. Slowly return to the starting position. 6. Repeat with your right leg and your left arm. Repeat __________ times. Complete this exercise __________ times a day. Abdominal set with straight leg raise  1. Lie on your back on a firm surface. 2. Bend one of your knees and keep your other leg straight. 3. Tense your abdominal muscles and lift your straight leg up, 4-6 inches (10-15 cm) off the ground. 4. Keep your abdominal muscles tight and hold this position for __________ seconds. ? Do not hold your breath. ? Do not arch your back. Keep it flat against the ground. 5. Keep your abdominal muscles tense as you slowly lower your leg back to the starting position. 6. Repeat with your other leg. Repeat __________ times. Complete this exercise __________ times a day. Single leg lower with bent knees 1. Lie on your back on a firm surface. 2. Tense your abdominal muscles and lift your feet off the floor, one foot at a time, so your knees and hips are bent in 90-degree angles (right angles). ? Your knees should be over your hips and your lower legs should be parallel to the floor. 3. Keeping your abdominal muscles tense and your knee bent, slowly lower one of your legs so your toe touches the ground. 4. Lift your leg back up to return to the starting position. ? Do not hold your breath. ? Do not let your back arch. Keep your back flat against the ground. 5. Repeat with your other leg. Repeat __________ times. Complete this exercise __________ times a day. Posture and body mechanics Good posture and healthy body mechanics can help to relieve stress in your body's tissues and joints. Body mechanics refers to the movements and positions of your body while you do  your daily activities. Posture is part of body mechanics. Good posture means:  Your spine is in its natural S-curve position (neutral).  Your shoulders are pulled back slightly.  Your head is not tipped forward. Follow these guidelines to improve your posture and body mechanics in your everyday activities. Standing   When standing, keep your spine neutral and your feet about hip width apart. Keep a slight bend in your knees. Your ears, shoulders, and hips should line up.  When you do a task in which you stand in one place for a long time, place one foot up on a stable object that is 2-4 inches (5-10 cm) high, such as a footstool. This helps keep your spine neutral. Sitting   When sitting, keep your spine neutral and keep your feet flat on the floor. Use a footrest, if necessary, and keep your thighs parallel to the floor. Avoid rounding your  shoulders, and avoid tilting your head forward.  When working at a desk or a computer, keep your desk at a height where your hands are slightly lower than your elbows. Slide your chair under your desk so you are close enough to maintain good posture.  When working at a computer, place your monitor at a height where you are looking straight ahead and you do not have to tilt your head forward or downward to look at the screen. Resting  When lying down and resting, avoid positions that are most painful for you.  If you have pain with activities such as sitting, bending, stooping, or squatting, lie in a position in which your body does not bend very much. For example, avoid curling up on your side with your arms and knees near your chest (fetal position).  If you have pain with activities such as standing for a long time or reaching with your arms, lie with your spine in a neutral position and bend your knees slightly. Try the following positions: ? Lying on your side with a pillow between your knees. ? Lying on your back with a pillow under your  knees. Lifting   When lifting objects, keep your feet at least shoulder width apart and tighten your abdominal muscles.  Bend your knees and hips and keep your spine neutral. It is important to lift using the strength of your legs, not your back. Do not lock your knees straight out.  Always ask for help to lift heavy or awkward objects. This information is not intended to replace advice given to you by your health care provider. Make sure you discuss any questions you have with your health care provider. Document Revised: 11/24/2018 Document Reviewed: 08/24/2018 Elsevier Patient Education  [place].

## 2020-04-30 NOTE — Progress Notes (Signed)
Assessment & Plan:  1. Lumbar muscle pain - Exercises provided for patient to do at home.  He was encouraged to continue the heating pad and add a muscle rub, NSAID such as his Aleve, and a muscle relaxer.  I have prescribed tizanidine.  We did discuss the option of dry needling with a physical therapist if his symptoms do not improve. - tiZANidine (ZANAFLEX) 4 MG tablet; Take 1 tablet (4 mg total) by mouth every 8 (eight) hours as needed for muscle spasms.  Dispense: 30 tablet; Refill: 0  2. Urinary problem - Patient will leave urine specimen on his way out. - Urinalysis, Complete - Urine Culture   Follow up plan: Return if symptoms worsen or fail to improve.  Hendricks Limes, MSN, APRN, FNP-C [facility]  Subjective:   Patient ID: Stephen Schwartz, male    DOB: 11/14/1970, 49 y.o.   MRN: [id number]  HPI: Stephen Schwartz is a 49 y.o. male presenting on 04/30/2020 for Back Pain (Patient states that he has been having lower back pain that started Sunday.) and trouble holding urine (x 2-3 months)  Back Pain: Patient presents for presents evaluation of low back problems.  Symptoms have been present for a few days and include pain in left low back (aching in character; 8-9/10 in severity). Initial inciting event: putting up a bounce house in the yard. Symptoms are not worse any particular time of day. Alleviating factors identifiable by patient are resting. Exacerbating factors identifiable by patient are bending and straightening up.  He is having a hard time with showers and even wiping after using the restroom. Treatments so far initiated by patient: heating pad and Bayer back and body.    Patient also reported to the nurse he has had trouble holding his urine for the past few months.  He reports to me this is actually been going on for a couple of years now.  He states when he has to go, he has to go right now.   ROS: Negative unless specifically indicated above in HPI.     Relevant past medical history reviewed and updated as indicated.   Allergies and medications reviewed and updated.   Current Outpatient Medications:    acetaminophen (TYLENOL) 500 MG tablet, Take 500 mg by mouth every 6 (six) hours as needed for moderate pain., Disp: , Rfl:    diltiazem (CARDIZEM CD) 360 MG 24 hr capsule, Take 1 capsule (360 mg total) by mouth at bedtime., Disp: 90 capsule, Rfl: 1   fluticasone (FLONASE) 50 MCG/ACT nasal spray, Place 2 sprays into both nostrils daily., Disp: 16 g, Rfl: 6   lisinopril (ZESTRIL) 20 MG tablet, Take 1 tablet (20 mg total) by mouth daily., Disp: 90 tablet, Rfl: 1   naproxen sodium (ALEVE) 220 MG tablet, Take 220 mg by mouth daily as needed (pain)., Disp: , Rfl:    Salicylic Acid 6 % SHAM, USE AS DIRECTED, Disp: 177 mL, Rfl: 11  No Known Allergies  Objective:   BP (!) 166/102    Pulse 64    Temp 97.9 F (36.6 C) (Temporal)    Ht 5\' 11"  (1.803 m)    Wt 227 lb 3.2 oz (103.1 kg)    SpO2 98%    BMI 31.69 kg/m    Physical Exam Vitals reviewed.  Constitutional:      General: He is not in acute distress.    Appearance: Normal appearance. He is obese. He is not ill-appearing, toxic-appearing or  diaphoretic.  HENT:     Head: Normocephalic and atraumatic.  Eyes:     General: No scleral icterus.       Right eye: No discharge.        Left eye: No discharge.     Conjunctiva/sclera: Conjunctivae normal.  Cardiovascular:     Rate and Rhythm: Normal rate.  Pulmonary:     Effort: Pulmonary effort is normal. No respiratory distress.  Musculoskeletal:        General: Normal range of motion.     Cervical back: Normal range of motion.     Lumbar back: Tenderness (left sided muscles) present. No swelling, edema, deformity, signs of trauma, lacerations, spasms or bony tenderness. Normal range of motion. No scoliosis.  Skin:    General: Skin is warm and dry.  Neurological:     Mental Status: He is alert and oriented to person, place, and  time. Mental status is at baseline.  Psychiatric:        Mood and Affect: Mood normal.        Behavior: Behavior normal.        Thought Content: Thought content normal.        Judgment: Judgment normal.

## 2020-05-02 LAB — URINE CULTURE: Organism ID, Bacteria: NO GROWTH

## 2020-05-03 ENCOUNTER — Encounter: Payer: Self-pay | Admitting: Family Medicine

## 2020-05-19 ENCOUNTER — Telehealth: Payer: Self-pay

## 2020-05-19 DIAGNOSIS — R3989 Other symptoms and signs involving the genitourinary system: Secondary | ICD-10-CM

## 2020-05-19 NOTE — Telephone Encounter (Signed)
Not sure why referral wasn't placed, but it is now.

## 2020-06-11 ENCOUNTER — Ambulatory Visit: Payer: No Typology Code available for payment source | Admitting: Family Medicine

## 2020-06-12 IMAGING — CR CHEST  1 VIEW
1 series · 1 of 1 positions shown · non-contrast
Comparison: None.

CLINICAL DATA: Shortness of breath

EXAM:
CHEST  1 VIEW

[ap]
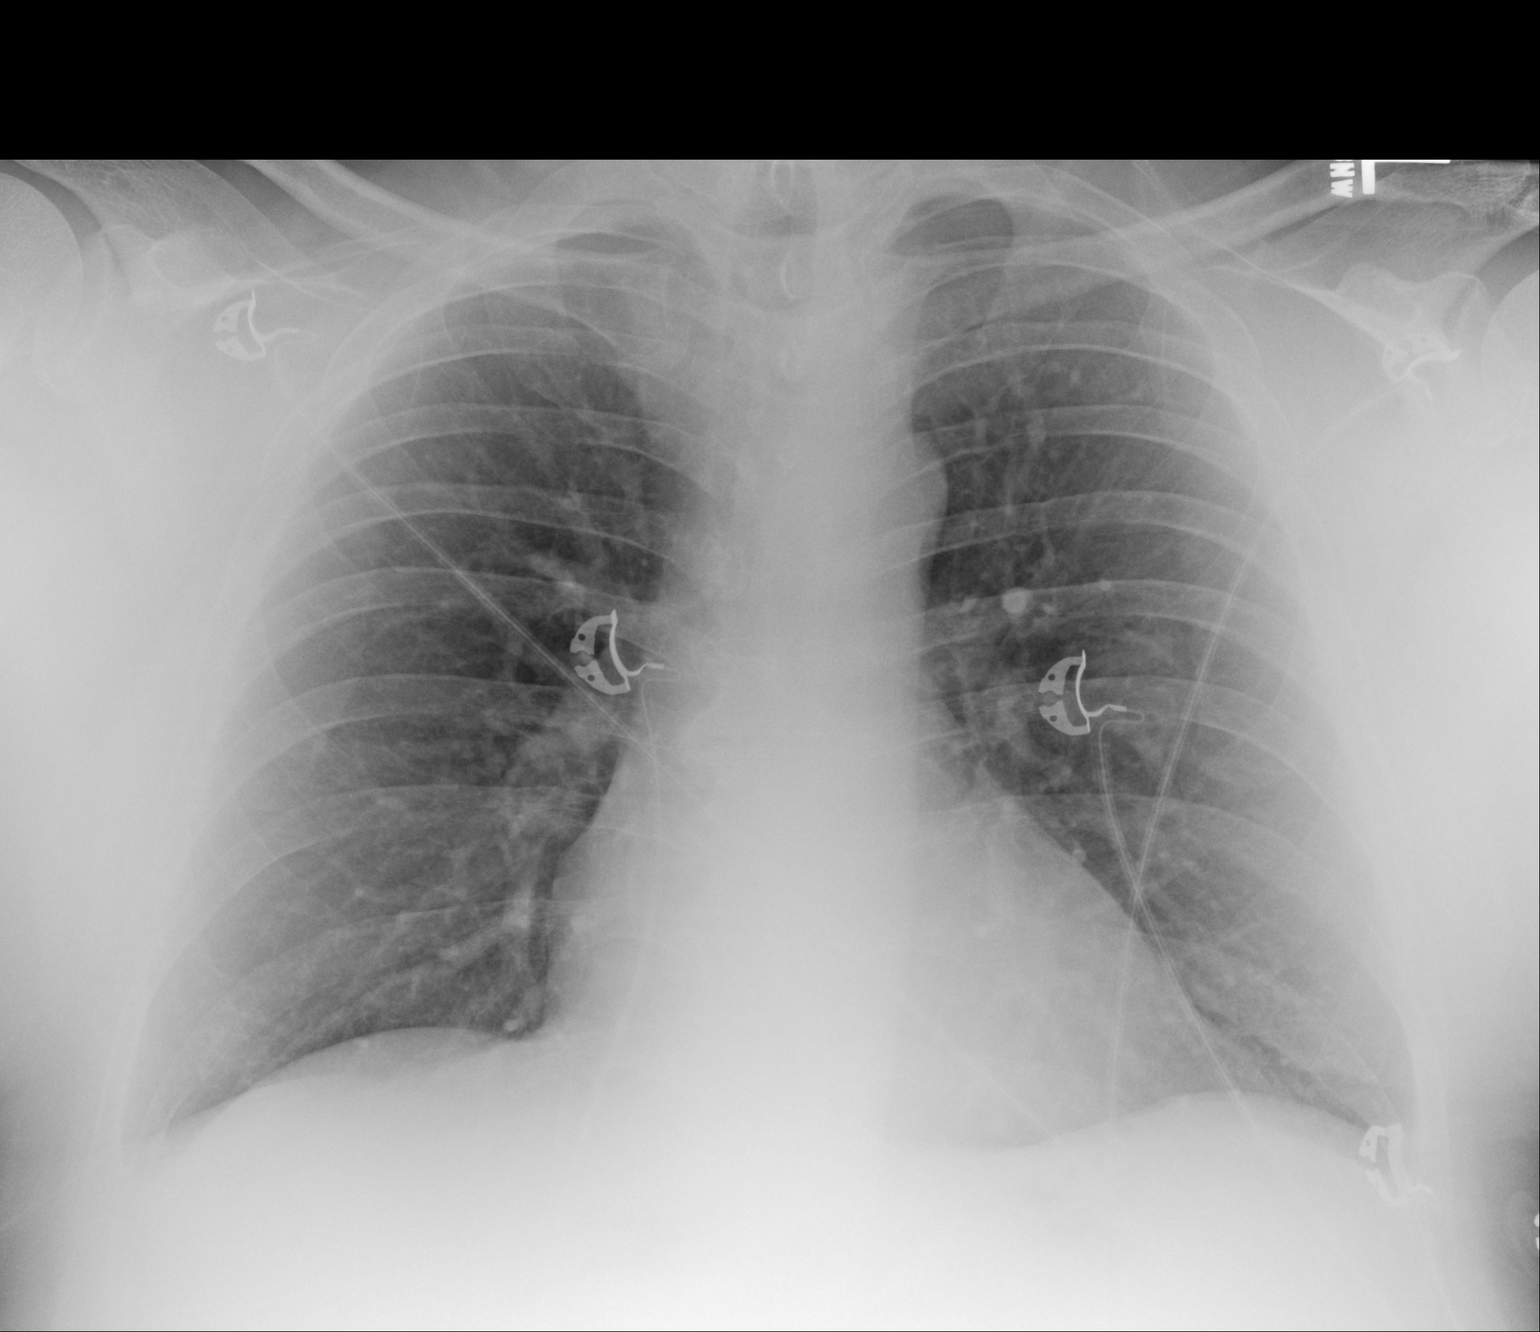

[1 of 1 positions shown; findings below may reference images not displayed]

FINDINGS: The heart size and mediastinal contours are within normal limits.
Both lungs are clear. The visualized skeletal structures are
unremarkable.
IMPRESSION: No acute abnormality of the lungs in AP portable projection. No
focal airspace opacity.

## 2020-07-01 ENCOUNTER — Other Ambulatory Visit: Payer: Self-pay

## 2020-07-01 ENCOUNTER — Ambulatory Visit (INDEPENDENT_AMBULATORY_CARE_PROVIDER_SITE_OTHER): Payer: BC Managed Care – PPO | Admitting: Urology

## 2020-07-01 ENCOUNTER — Encounter: Payer: Self-pay | Admitting: Urology

## 2020-07-01 VITALS — BP 164/93 | HR 76 | Temp 98.9°F | Ht 71.0 in | Wt 219.0 lb

## 2020-07-01 DIAGNOSIS — R350 Frequency of micturition: Secondary | ICD-10-CM | POA: Diagnosis not present

## 2020-07-01 LAB — MICROSCOPIC EXAMINATION
Bacteria, UA: NONE SEEN
Epithelial Cells (non renal): NONE SEEN /hpf (ref 0–10)
Renal Epithel, UA: NONE SEEN /hpf
WBC, UA: NONE SEEN /hpf (ref 0–5)

## 2020-07-01 LAB — URINALYSIS, ROUTINE W REFLEX MICROSCOPIC
Bilirubin, UA: NEGATIVE
Glucose, UA: NEGATIVE
Leukocytes,UA: NEGATIVE
Nitrite, UA: NEGATIVE
Specific Gravity, UA: >1.03 — ABNORMAL HIGH (ref 1.005–1.030)
Urobilinogen, Ur: 1 mg/dL (ref 0.2–1.0)
pH, UA: 6 (ref 5.0–7.5)

## 2020-07-01 LAB — BLADDER SCAN AMB NON-IMAGING: Scan Result: 17

## 2020-07-01 MED ORDER — MIRABEGRON ER 25 MG PO TB24: 25.0000 mg | ORAL_TABLET | Freq: Every day | ORAL | 0 refills | Status: DC

## 2020-07-01 NOTE — Progress Notes (Signed)
Bladder Scan Patient can void: 17 ml Performed By: Cherylanne Ardelean,LPN  Urological Symptom Review  Patient is experiencing the following symptoms: Frequent urination Hard to postpone urination Get up at night to urinate  Kidney stones   Review of Systems  Gastrointestinal (upper)  : Negative for upper GI symptoms  Gastrointestinal (lower) : Negative for lower GI symptoms  Constitutional : Negative for symptoms  Skin: Negative for skin symptoms  Eyes: Negative for eye symptoms  Ear/Nose/Throat : Negative for Ear/Nose/Throat symptoms  Hematologic/Lymphatic: Negative for Hematologic/Lymphatic symptoms  Cardiovascular : Negative for cardiovascular symptoms  Respiratory : Negative for respiratory symptoms  Endocrine: Negative for endocrine symptoms  Musculoskeletal: Negative for musculoskeletal symptoms  Neurological: Negative for neurological symptoms  Psychologic: Negative for psychiatric symptoms

## 2020-07-01 NOTE — Patient Instructions (Signed)

## 2020-07-01 NOTE — Progress Notes (Signed)
07/01/2020 2:04 PM   Stephen Schwartz 09-24-1970 [id number]  Referring provider: Claretta Fraise, MD [place],  [place] [address]  Urinary frequency  HPI: Stephen Schwartz is a 49yo here for evaluation of urinary frequency. Starting over 2 years ago he developed urinary urgency, frequency. He was treated with 2 course of cipro and doxycycline by Dr. Livia Schwartz. His urinary symptoms have progressed to nocturia 4-5x. No hesitancy, starting/stopping, straining, dysuria or hematuria. No Hx of UTI. PVR 26cc. Stream strong.    PMH: Past Medical History:  Diagnosis Date  . Hip pain   . History of nephrolithiasis   . Hypertension   . Psoriasis   . Psoriasis     Surgical History: History reviewed. No pertinent surgical history.  Home Medications:  Allergies as of 07/01/2020   No Known Allergies     Medication List       Accurate as of July 01, 2020  2:04 PM. If you have any questions, ask your nurse or doctor.        acetaminophen 500 MG tablet Commonly known as: TYLENOL Take 500 mg by mouth every 6 (six) hours as needed for moderate pain.   CENTRUM SILVER PO Take by mouth.   diltiazem 360 MG 24 hr capsule Commonly known as: CARDIZEM CD Take 1 capsule (360 mg total) by mouth at bedtime.   fluticasone 50 MCG/ACT nasal spray Commonly known as: FLONASE Place 2 sprays into both nostrils daily.   lisinopril 20 MG tablet Commonly known as: ZESTRIL Take 1 tablet (20 mg total) by mouth daily.   naproxen sodium 220 MG tablet Commonly known as: ALEVE Take 220 mg by mouth daily as needed (pain).   Salicylic Acid 6 % Sham USE AS DIRECTED   tiZANidine 4 MG tablet Commonly known as: Zanaflex Take 1 tablet (4 mg total) by mouth every 8 (eight) hours as needed for muscle spasms.       Allergies: No Known Allergies  Family History: History reviewed. No pertinent family history.  Social History:  reports that he has never smoked. He has never used smokeless tobacco.  He reports that he does not drink alcohol and does not use drugs.  ROS: All other review of systems were reviewed and are negative except what is noted above in HPI  Physical Exam: BP (!) 164/93   Pulse 76   Temp 98.9 F (37.2 C)   Ht 5\' 11"  (1.803 m)   Wt 219 lb (99.3 kg)   BMI 30.54 kg/m   Constitutional:  Alert and oriented, No acute distress. HEENT: [place] AT, moist mucus membranes.  Trachea midline, no masses. Cardiovascular: No clubbing, cyanosis, or edema. Respiratory: Normal respiratory effort, no increased work of breathing. GI: Abdomen is soft, nontender, nondistended, no abdominal masses GU: No CVA tenderness. Circumcised phallus. No masses/lesions on penis, testis, scrotum. Prostate 30g smooth no nodules no induration.  Lymph: No cervical or inguinal lymphadenopathy. Skin: No rashes, bruises or suspicious lesions. Neurologic: Grossly intact, no focal deficits, moving all 4 extremities. Psychiatric: Normal mood and affect.  Laboratory Data: Lab Results  Component Value Date   WBC 4.7 11/01/2019   HGB 14.5 11/01/2019   HCT 44.1 11/01/2019   MCV 79 11/01/2019   PLT 270 11/01/2019    Lab Results  Component Value Date   CREATININE 1.41 (H) 11/01/2019    No results found for: PSA  No results found for: TESTOSTERONE  No results found for: HGBA1C  Urinalysis  Component Value Date/Time   APPEARANCEUR Clear 04/30/2020 1455   GLUCOSEU Negative 04/30/2020 1455   BILIRUBINUR Negative 04/30/2020 1455   PROTEINUR Negative 04/30/2020 1455   UROBILINOGEN negative 09/12/2014 1606   NITRITE Negative 04/30/2020 1455   LEUKOCYTESUR Negative 04/30/2020 1455    Lab Results  Component Value Date   LABMICR See below: 04/30/2020   WBCUA 0-5 04/30/2020   RBCUA None seen 03/01/2017   LABEPIT None seen 04/30/2020   MUCUS neg 09/12/2014   BACTERIA None seen 04/30/2020    Pertinent Imaging:  No results found for this or any previous visit.  No results found for this  or any previous visit.  No results found for this or any previous visit.  No results found for this or any previous visit.  No results found for this or any previous visit.  No results found for this or any previous visit.  No results found for this or any previous visit.  No results found for this or any previous visit.   Assessment & Plan:    1. Urinary frequency -We will trial mirabegron 25mg   - Urinalysis, Routine w reflex microscopic - Bladder Scan (Post Void Residual) in office   No follow-ups on file.  Nicolette Bang, MD  [facility] [place]

## 2020-07-15 ENCOUNTER — Encounter (HOSPITAL_COMMUNITY): Payer: Self-pay | Admitting: Emergency Medicine

## 2020-07-15 ENCOUNTER — Emergency Department (HOSPITAL_COMMUNITY)
Admission: EM | Admit: 2020-07-15 | Discharge: 2020-07-15 | Disposition: A | Discharge status: Home / Self Care | Payer: No Typology Code available for payment source | Attending: Emergency Medicine | Admitting: Emergency Medicine

## 2020-07-15 ENCOUNTER — Other Ambulatory Visit: Payer: Self-pay

## 2020-07-15 DIAGNOSIS — R202 Paresthesia of skin: Secondary | ICD-10-CM | POA: Diagnosis present

## 2020-07-15 DIAGNOSIS — M5410 Radiculopathy, site unspecified: Secondary | ICD-10-CM | POA: Diagnosis not present

## 2020-07-15 DIAGNOSIS — Z79899 Other long term (current) drug therapy: Secondary | ICD-10-CM | POA: Insufficient documentation

## 2020-07-15 DIAGNOSIS — M545 Low back pain, unspecified: Secondary | ICD-10-CM | POA: Insufficient documentation

## 2020-07-15 DIAGNOSIS — I10 Essential (primary) hypertension: Secondary | ICD-10-CM | POA: Insufficient documentation

## 2020-07-15 MED ORDER — PREDNISONE 10 MG PO TABS: 40.0000 mg | ORAL_TABLET | Freq: Every day | ORAL | 0 refills | Status: DC

## 2020-07-15 MED ORDER — PREDNISONE 50 MG PO TABS
60.0000 mg | ORAL_TABLET | Freq: Once | ORAL | Status: AC
  Administered 2020-07-15: 60 mg via ORAL
  Filled 2020-07-15: qty 1

## 2020-07-15 NOTE — ED Provider Notes (Signed)
[facility] Provider Note   CSN: [id number] Arrival date & time: 07/15/20  0557     History Chief Complaint  Patient presents with  . L. arm numbness and tingling    Stephen Schwartz is a 49 y.o. male.  Patient with complaint of left arm pain numbness and tingling that involves mostly the index finger and thumb.  Ongoing for 1 week.  Associated with some discomfort at the base of the neck on the left side.  Denies any weakness.  Also has some left-sided lumbar pain.  But has no lower extremity numbness or weakness.        Past Medical History:  Diagnosis Date  . Hip pain   . History of nephrolithiasis   . Hypertension   . Psoriasis   . Psoriasis     Patient Active Problem List   Diagnosis Date Noted  . Primary osteoarthritis of left hip 01/25/2020  . Essential hypertension 03/31/2018  . Encounter for Department of Transportation (DOT) examination for driving license renewal 38/75/6433  . Psoriasis 12/08/2012    History reviewed. No pertinent surgical history.     History reviewed. No pertinent family history.  Social History   Tobacco Use  . Smoking status: Never Smoker  . Smokeless tobacco: Never Used  Substance Use Topics  . Alcohol use: No  . Drug use: No    Home Medications Prior to Admission medications   Medication Sig Start Date End Date Taking? Authorizing Provider  acetaminophen (TYLENOL) 500 MG tablet Take 500 mg by mouth every 6 (six) hours as needed for moderate pain. Patient not taking: Reported on 07/01/2020    [provider]  diltiazem (CARDIZEM CD) 360 MG 24 hr capsule Take 1 capsule (360 mg total) by mouth at bedtime. Patient not taking: Reported on 07/01/2020 07/20/19   Claretta Fraise, MD  fluticasone [facility]) 50 MCG/ACT nasal spray Place 2 sprays into both nostrils daily. Patient not taking: Reported on 07/01/2020 01/04/19   Evelina Dun A, FNP  lisinopril (ZESTRIL) 20 MG tablet Take 1 tablet (20 mg  total) by mouth daily. 01/08/20   Claretta Fraise, MD  mirabegron ER (MYRBETRIQ) 25 MG TB24 tablet Take 1 tablet (25 mg total) by mouth daily. 07/01/20   McKenzie, Candee Furbish, MD  Multiple Vitamins-Minerals (CENTRUM SILVER PO) Take by mouth. Patient not taking: Reported on 07/01/2020    [provider]  naproxen sodium (ALEVE) 220 MG tablet Take 220 mg by mouth daily as needed (pain). Patient not taking: Reported on 07/01/2020    [provider]  Salicylic Acid 6 % SHAM USE AS DIRECTED Patient not taking: Reported on 07/01/2020 11/29/18   Janora Norlander, DO  tiZANidine (ZANAFLEX) 4 MG tablet Take 1 tablet (4 mg total) by mouth every 8 (eight) hours as needed for muscle spasms. 04/30/20   Loman Brooklyn, FNP    Allergies    Patient has no known allergies.  Review of Systems   Review of Systems  Constitutional: Negative for chills and fever.  HENT: Negative for congestion, rhinorrhea and sore throat.   Eyes: Negative for visual disturbance.  Respiratory: Negative for cough and shortness of breath.   Cardiovascular: Negative for chest pain and leg swelling.  Gastrointestinal: Negative for abdominal pain, diarrhea, nausea and vomiting.  Genitourinary: Negative for dysuria.  Musculoskeletal: Positive for neck pain. Negative for back pain.  Skin: Negative for rash.  Neurological: Positive for numbness. Negative for dizziness, light-headedness and headaches.  Hematological: Does  not bruise/bleed easily.  Psychiatric/Behavioral: Negative for confusion.    Physical Exam Updated Vital Signs BP (!) 165/100   Pulse (!) 46   Temp 98.5 F (36.9 C) (Oral)   Resp 14   Ht 1.803 m (5\' 11" )   Wt 99.3 kg   SpO2 96%   BMI 30.53 kg/m   Physical Exam Vitals and nursing note reviewed.  Constitutional:      General: He is not in acute distress.    Appearance: He is well-developed.  HENT:     Head: Normocephalic and atraumatic.  Eyes:     Extraocular Movements:  Extraocular movements intact.     Conjunctiva/sclera: Conjunctivae normal.     Pupils: Pupils are equal, round, and reactive to light.  Cardiovascular:     Rate and Rhythm: Normal rate and regular rhythm.     Heart sounds: No murmur heard.   Pulmonary:     Effort: Pulmonary effort is normal. No respiratory distress.     Breath sounds: Normal breath sounds.  Abdominal:     Palpations: Abdomen is soft.     Tenderness: There is no abdominal tenderness.  Musculoskeletal:        General: Normal range of motion.     Cervical back: Normal range of motion and neck supple.     Comments: Left upper extremity good cap refill.  Radial pulse 2+.  Good strength at wrist elbow shoulder.  Also good finger strength.  Normal pinch strength.  And normal grip and extension.  Subjective numbness to thumb and index finger.  Skin:    General: Skin is warm and dry.     Capillary Refill: Capillary refill takes less than 2 seconds.  Neurological:     General: No focal deficit present.     Mental Status: He is alert and oriented to person, place, and time.     Cranial Nerves: No cranial nerve deficit.     Sensory: No sensory deficit.     Motor: No weakness.     ED Results / Procedures / Treatments   Labs (all labs ordered are listed, but only abnormal results are displayed) Labs Reviewed - No data to display  EKG EKG Interpretation  Date/Time:  Tuesday July 15 2020 06:50:00 EST Ventricular Rate:  56 PR Interval:    QRS Duration: 80 QT Interval:  411 QTC Calculation: 397 R Axis:   28 Text Interpretation: Sinus arrhythmia Abnormal R-wave progression, early transition Borderline T wave abnormalities No significant change since last tracing Confirmed by Fredia Sorrow [phone]) on 07/15/2020 7:36:17 AM   Radiology No results found.  Procedures Procedures (including critical care time)  Medications Ordered in ED Medications  predniSONE (DELTASONE) tablet 60 mg (has no administration in  time range)    ED Course  I have reviewed the triage vital signs and the nursing notes.  Pertinent labs & imaging results that were available during my care of the patient were reviewed by me and considered in my medical decision making (see chart for details).    MDM Rules/Calculators/A&P                          Symptoms consistent with left-sided cervical radiculopathy.  Symptoms only ongoing for a week.  So we will treat symptoms with a course of prednisone have him follow-up with primary care doctor.  If symptoms persist MRI cervical pain would be appropriate  First dose of prednisone 60 mg given here.  No significant motor weakness at this time.   Final Clinical Impression(s) / ED Diagnoses Final diagnoses:  Radiculopathy, unspecified spinal region    Rx / DC Orders ED Discharge Orders    None       Fredia Sorrow, MD 07/15/20 [phone]

## 2020-07-15 NOTE — ED Triage Notes (Signed)
Pt states he thinks he pulled a muscle a month ago and started noticing numbness and tingling in L arm  And handwhen he pushed on that muscle. Pt recently placed on a medication (Myrbetriq) by his urologist that had side effects of HTN (which he has noticed at home) and he "was wondering if this could be causing his numbness and tingling". Also c/o lower back pain since "pulling a muscle a month ago".

## 2020-07-15 NOTE — Discharge Instructions (Signed)
Take the prednisone as directed.  If symptoms do not improve follow-up with your primary care doctor.  If symptoms persist persist over a 2-week period then MRI cervical spine would be appropriate for further work-up.

## 2020-07-22 ENCOUNTER — Ambulatory Visit: Payer: No Typology Code available for payment source | Admitting: Nurse Practitioner

## 2020-07-29 ENCOUNTER — Encounter: Payer: Self-pay | Admitting: Urology

## 2020-07-29 ENCOUNTER — Ambulatory Visit (INDEPENDENT_AMBULATORY_CARE_PROVIDER_SITE_OTHER): Payer: No Typology Code available for payment source | Admitting: Urology

## 2020-07-29 ENCOUNTER — Other Ambulatory Visit: Payer: Self-pay

## 2020-07-29 VITALS — BP 169/105 | HR 54 | Temp 98.3°F | Ht 71.0 in | Wt 225.0 lb

## 2020-07-29 DIAGNOSIS — R350 Frequency of micturition: Secondary | ICD-10-CM | POA: Diagnosis not present

## 2020-07-29 LAB — URINALYSIS, ROUTINE W REFLEX MICROSCOPIC
Bilirubin, UA: NEGATIVE
Glucose, UA: NEGATIVE
Ketones, UA: NEGATIVE
Leukocytes,UA: NEGATIVE
Nitrite, UA: NEGATIVE
Specific Gravity, UA: >1.03 — ABNORMAL HIGH (ref 1.005–1.030)
Urobilinogen, Ur: 0.2 mg/dL (ref 0.2–1.0)
pH, UA: 5.5 (ref 5.0–7.5)

## 2020-07-29 LAB — MICROSCOPIC EXAMINATION
Bacteria, UA: NONE SEEN
Epithelial Cells (non renal): NONE SEEN /hpf (ref 0–10)
Renal Epithel, UA: NONE SEEN /hpf
WBC, UA: NONE SEEN /hpf (ref 0–5)

## 2020-07-29 LAB — BLADDER SCAN AMB NON-IMAGING: Scan Result: 6

## 2020-07-29 MED ORDER — GEMTESA 75 MG PO TABS: 1.0000 | ORAL_TABLET | Freq: Every day | ORAL | 0 refills | Status: DC

## 2020-07-29 NOTE — Progress Notes (Signed)
Bladder Scan Patient can void: 6 ml Performed By: Keyon Liller,lpn  Urological Symptom Review  Patient is experiencing the following symptoms: Hard to postpone urination Get up at night to urinate Blood in urine   Review of Systems  Gastrointestinal (upper)  : Negative for upper GI symptoms  Gastrointestinal (lower) : Negative for lower GI symptoms  Constitutional : Negative for symptoms  Skin: Negative for skin symptoms  Eyes: Negative for eye symptoms  Ear/Nose/Throat : Negative for Ear/Nose/Throat symptoms  Hematologic/Lymphatic: Negative for Hematologic/Lymphatic symptoms  Cardiovascular : Negative for cardiovascular symptoms  Respiratory : Negative for respiratory symptoms  Endocrine: Negative for endocrine symptoms  Musculoskeletal: Negative for musculoskeletal symptoms  Neurological: Negative for neurological symptoms  Psychologic: Negative for psychiatric symptoms

## 2020-07-29 NOTE — Progress Notes (Signed)
07/29/2020 3:21 PM   Stephen Schwartz Aug 28, 1970 [id number]  Referring provider: Claretta Fraise, MD [place],  [place] [address]  Followup urinary urgency and frequency  HPI: Stephen Schwartz is a 49yo here for followup for urinary urgency and frequency. Last visit we started mirabegron 25mg  which failed to improve his urinary frequency and urgency. He has associated nocturia 1-2x. Stream is strong. No urinayr hesitancy, no dribbling.    PMH: Past Medical History:  Diagnosis Date  . Hip pain   . History of nephrolithiasis   . Hypertension   . Psoriasis   . Psoriasis     Surgical History: No past surgical history on file.  Home Medications:  Allergies as of 07/29/2020   No Known Allergies     Medication List       Accurate as of July 29, 2020  3:21 PM. If you have any questions, ask your nurse or doctor.        acetaminophen 500 MG tablet Commonly known as: TYLENOL Take 500 mg by mouth every 6 (six) hours as needed for moderate pain.   CENTRUM SILVER PO Take by mouth.   diltiazem 360 MG 24 hr capsule Commonly known as: CARDIZEM CD Take 1 capsule (360 mg total) by mouth at bedtime.   fluticasone 50 MCG/ACT nasal spray Commonly known as: FLONASE Place 2 sprays into both nostrils daily.   lisinopril 20 MG tablet Commonly known as: ZESTRIL Take 1 tablet (20 mg total) by mouth daily.   mirabegron ER 25 MG Tb24 tablet Commonly known as: MYRBETRIQ Take 1 tablet (25 mg total) by mouth daily.   naproxen sodium 220 MG tablet Commonly known as: ALEVE Take 220 mg by mouth daily as needed (pain).   predniSONE 10 MG tablet Commonly known as: DELTASONE Take 4 tablets (40 mg total) by mouth daily.   Salicylic Acid 6 % Sham USE AS DIRECTED   tiZANidine 4 MG tablet Commonly known as: Zanaflex Take 1 tablet (4 mg total) by mouth every 8 (eight) hours as needed for muscle spasms.       Allergies: No Known Allergies  Family History: No family history  on file.  Social History:  reports that he has never smoked. He has never used smokeless tobacco. He reports that he does not drink alcohol and does not use drugs.  ROS: All other review of systems were reviewed and are negative except what is noted above in HPI  Physical Exam: BP (!) 169/105   Pulse (!) 54   Temp 98.3 F (36.8 C)   Ht 5\' 11"  (1.803 m)   Wt 225 lb (102.1 kg)   BMI 31.38 kg/m   Constitutional:  Alert and oriented, No acute distress. HEENT: [place] AT, moist mucus membranes.  Trachea midline, no masses. Cardiovascular: No clubbing, cyanosis, or edema. Respiratory: Normal respiratory effort, no increased work of breathing. GI: Abdomen is soft, nontender, nondistended, no abdominal masses GU: No CVA tenderness.  Lymph: No cervical or inguinal lymphadenopathy. Skin: No rashes, bruises or suspicious lesions. Neurologic: Grossly intact, no focal deficits, moving all 4 extremities. Psychiatric: Normal mood and affect.  Laboratory Data: Lab Results  Component Value Date   WBC 4.7 11/01/2019   HGB 14.5 11/01/2019   HCT 44.1 11/01/2019   MCV 79 11/01/2019   PLT 270 11/01/2019    Lab Results  Component Value Date   CREATININE 1.41 (H) 11/01/2019    No results found for: PSA  No results found for:  TESTOSTERONE  No results found for: HGBA1C  Urinalysis    Component Value Date/Time   APPEARANCEUR Clear 07/01/2020 1352   GLUCOSEU Negative 07/01/2020 1352   BILIRUBINUR Negative 07/01/2020 1352   PROTEINUR 1+ (A) 07/01/2020 1352   UROBILINOGEN negative 09/12/2014 1606   NITRITE Negative 07/01/2020 1352   LEUKOCYTESUR Negative 07/01/2020 1352    Lab Results  Component Value Date   LABMICR See below: 07/01/2020   WBCUA None seen 07/01/2020   RBCUA None seen 03/01/2017   LABEPIT None seen 07/01/2020   MUCUS Present 07/01/2020   BACTERIA None seen 07/01/2020    Pertinent Imaging:  No results found for this or any previous visit.  No results found for  this or any previous visit.  No results found for this or any previous visit.  No results found for this or any previous visit.  No results found for this or any previous visit.  No results found for this or any previous visit.  No results found for this or any previous visit.  No results found for this or any previous visit.   Assessment & Plan:    1. Urinary frequency -We wil trial gemtesa 75mg  daily. If this fails to improve his frequency we will proceed with alpha blocker therapy.  - Urinalysis, Routine w reflex microscopic - BLADDER SCAN AMB NON-IMAGING   No follow-ups on file.  Nicolette Bang, MD  [facility] [place]

## 2020-07-29 NOTE — Patient Instructions (Signed)

## 2020-08-17 ENCOUNTER — Encounter (HOSPITAL_COMMUNITY): Payer: Self-pay | Admitting: Emergency Medicine

## 2020-08-17 ENCOUNTER — Other Ambulatory Visit: Payer: Self-pay

## 2020-08-17 DIAGNOSIS — R0981 Nasal congestion: Secondary | ICD-10-CM | POA: Diagnosis present

## 2020-08-17 DIAGNOSIS — Z5321 Procedure and treatment not carried out due to patient leaving prior to being seen by health care provider: Secondary | ICD-10-CM | POA: Insufficient documentation

## 2020-08-17 DIAGNOSIS — U071 COVID-19: Secondary | ICD-10-CM | POA: Diagnosis not present

## 2020-08-17 LAB — RESP PANEL BY RT-PCR (FLU A&B, COVID) ARPGX2
Influenza A by PCR: NEGATIVE
Influenza B by PCR: NEGATIVE
SARS Coronavirus 2 by RT PCR: POSITIVE — AB

## 2020-08-17 NOTE — ED Notes (Signed)
Date and time results received: 08/17/20 2308   Test: COVID Critical Value: POSITIVE  Name of Provider Notified: EDP,Default Provider

## 2020-08-17 NOTE — ED Triage Notes (Signed)
Pt to the ED with c/o nasal congestion, chills, fever, and cough since Friday.  Denies known Covid exposure.

## 2020-08-18 ENCOUNTER — Telehealth: Payer: Self-pay

## 2020-08-18 ENCOUNTER — Emergency Department (HOSPITAL_COMMUNITY)
Admission: EM | Admit: 2020-08-18 | Discharge: 2020-08-18 | Disposition: A | Discharge status: Home / Self Care | Payer: No Typology Code available for payment source | Attending: Emergency Medicine | Admitting: Emergency Medicine

## 2020-08-18 ENCOUNTER — Telehealth: Payer: Self-pay | Admitting: Adult Health

## 2020-08-18 NOTE — Telephone Encounter (Signed)
Patient given test results and advised per positive covid protocol.

## 2020-08-18 NOTE — Telephone Encounter (Signed)
Called to discuss with patient about COVID-19 symptoms and the use of one of the available treatments for those with mild to moderate Covid symptoms and at a high risk of hospitalization.  Pt appears to qualify for outpatient treatment due to co-morbid conditions and/or a member of an at-risk group in accordance with the FDA Emergency Use Authorization.    Symptom onset: 08/15/2020 Vaccinated: unsure Qualifiers: HTN, BMI, ethnicity  Unable to reach pt - LMOM to call us back. Call back number: [phone]   Noreene Filbert

## 2020-08-26 ENCOUNTER — Ambulatory Visit: Payer: No Typology Code available for payment source | Admitting: Orthopaedic Surgery

## 2020-09-03 ENCOUNTER — Ambulatory Visit: Payer: No Typology Code available for payment source | Admitting: Urology

## 2020-09-08 ENCOUNTER — Ambulatory Visit: Payer: No Typology Code available for payment source | Admitting: Urology

## 2020-11-19 ENCOUNTER — Other Ambulatory Visit: Payer: Self-pay | Admitting: *Deleted

## 2020-11-19 ENCOUNTER — Telehealth: Payer: Self-pay

## 2020-11-19 DIAGNOSIS — I10 Essential (primary) hypertension: Secondary | ICD-10-CM

## 2020-11-19 MED ORDER — LISINOPRIL 20 MG PO TABS: 20.0000 mg | ORAL_TABLET | Freq: Every day | ORAL | 0 refills | Status: DC

## 2020-11-19 NOTE — Telephone Encounter (Signed)
Refill sent.

## 2020-11-19 NOTE — Telephone Encounter (Signed)
  Prescription Request  11/19/2020  What is the name of the medication or equipment? lisinopril (ZESTRIL) 20 MG tablet  Have you contacted your pharmacy to request a refill? (if applicable) no  Which pharmacy would you like this sent to? [facility]. Pt has apt on 12/01/2020 with stacks   Patient notified that their request is being sent to the clinical staff for review and that they should receive a response within 2 business days.

## 2020-12-01 ENCOUNTER — Encounter: Payer: Self-pay | Admitting: Family Medicine

## 2020-12-01 ENCOUNTER — Ambulatory Visit (INDEPENDENT_AMBULATORY_CARE_PROVIDER_SITE_OTHER): Payer: BC Managed Care – PPO | Admitting: Family Medicine

## 2020-12-01 ENCOUNTER — Other Ambulatory Visit: Payer: Self-pay

## 2020-12-01 VITALS — BP 154/103 | Temp 97.2°F | Ht 71.0 in | Wt 224.2 lb

## 2020-12-01 DIAGNOSIS — Z0001 Encounter for general adult medical examination with abnormal findings: Secondary | ICD-10-CM | POA: Diagnosis not present

## 2020-12-01 DIAGNOSIS — Z0000 Encounter for general adult medical examination without abnormal findings: Secondary | ICD-10-CM

## 2020-12-01 DIAGNOSIS — R350 Frequency of micturition: Secondary | ICD-10-CM | POA: Diagnosis not present

## 2020-12-01 DIAGNOSIS — Z125 Encounter for screening for malignant neoplasm of prostate: Secondary | ICD-10-CM | POA: Diagnosis not present

## 2020-12-01 DIAGNOSIS — Z1159 Encounter for screening for other viral diseases: Secondary | ICD-10-CM

## 2020-12-01 DIAGNOSIS — Z114 Encounter for screening for human immunodeficiency virus [HIV]: Secondary | ICD-10-CM

## 2020-12-01 DIAGNOSIS — I10 Essential (primary) hypertension: Secondary | ICD-10-CM | POA: Diagnosis not present

## 2020-12-01 DIAGNOSIS — Z1211 Encounter for screening for malignant neoplasm of colon: Secondary | ICD-10-CM

## 2020-12-01 DIAGNOSIS — E559 Vitamin D deficiency, unspecified: Secondary | ICD-10-CM

## 2020-12-01 LAB — URINALYSIS
Bilirubin, UA: NEGATIVE
Glucose, UA: NEGATIVE
Ketones, UA: NEGATIVE
Leukocytes,UA: NEGATIVE
Nitrite, UA: NEGATIVE
Specific Gravity, UA: 1.015 (ref 1.005–1.030)
Urobilinogen, Ur: 0.2 mg/dL (ref 0.2–1.0)
pH, UA: 5.5 (ref 5.0–7.5)

## 2020-12-01 MED ORDER — GEMTESA 75 MG PO TABS: 1.0000 | ORAL_TABLET | Freq: Every day | ORAL | 0 refills | Status: DC

## 2020-12-01 MED ORDER — AMLODIPINE BESYLATE 5 MG PO TABS: 5.0000 mg | ORAL_TABLET | Freq: Every day | ORAL | 5 refills | Status: DC

## 2020-12-01 MED ORDER — LISINOPRIL 20 MG PO TABS: 20.0000 mg | ORAL_TABLET | Freq: Every day | ORAL | 3 refills | Status: DC

## 2020-12-01 NOTE — Progress Notes (Signed)
Subjective:  Patient ID: Stephen Schwartz, male    DOB: Jun 09, 1971  Age: 50 y.o. MRN: [id number]  CC: Annual Exam   HPI Stephen Schwartz presents for annual physical/ HTN  Depression screen [facility] 2/9 12/01/2020 11/01/2019 01/26/2018  Decreased Interest 0 0 0  Down, Depressed, Hopeless 0 0 0  PHQ - 2 Score 0 0 0  Altered sleeping - - -  Tired, decreased energy - - -  Change in appetite - - -  Feeling bad or failure about yourself  - - -  Trouble concentrating - - -  Moving slowly or fidgety/restless - - -  Suicidal thoughts - - -  PHQ-9 Score - - -    History Stephen Schwartz has a past medical history of Hip pain, History of nephrolithiasis, Hypertension, Psoriasis, and Psoriasis.   He has no past surgical history on file.   His family history is not on file.He reports that he has never smoked. He has never used smokeless tobacco. He reports that he does not drink alcohol and does not use drugs.    ROS Review of Systems  Endocrine: Positive for polyuria. Negative for cold intolerance, heat intolerance, polydipsia and polyphagia.  Genitourinary: Positive for frequency (onset last week q2-3 hours. ).    Objective:  BP (!) 154/103   Temp (!) 97.2 F (36.2 C)   Ht _0  (1.803 m)   Wt 224 lb 3.2 oz (101.7 kg)   BMI 31.27 kg/m   BP Readings from Last 3 Encounters:  12/01/20 (!) 154/103  08/17/20 (!) 171/117  07/29/20 (!) 169/105    Wt Readings from Last 3 Encounters:  12/01/20 224 lb 3.2 oz (101.7 kg)  08/17/20 222 lb (100.7 kg)  07/29/20 225 lb (102.1 kg)     Physical Exam Constitutional:      Appearance: He is well-developed.  HENT:     Head: Normocephalic and atraumatic.  Eyes:     Pupils: Pupils are equal, round, and reactive to light.  Neck:     Thyroid: No thyromegaly.     Trachea: No tracheal deviation.  Cardiovascular:     Rate and Rhythm: Normal rate and regular rhythm.     Heart sounds: Normal heart sounds. No murmur heard. No friction rub. No gallop.    Pulmonary:     Breath sounds: Normal breath sounds. No wheezing or rales.  Abdominal:     General: Bowel sounds are normal. There is no distension.     Palpations: Abdomen is soft. There is no mass.     Tenderness: There is no abdominal tenderness.     Hernia: There is no hernia in the left inguinal area.  Genitourinary:    Penis: Normal.      Testes: Normal.  Musculoskeletal:        General: Normal range of motion.     Cervical back: Normal range of motion.  Lymphadenopathy:     Cervical: No cervical adenopathy.  Skin:    General: Skin is warm and dry.  Neurological:     Mental Status: He is alert and oriented to person, place, and time.       Assessment & Plan:   Stephen Schwartz was seen today for annual exam.  Diagnoses and all orders for this visit:  Well adult exam  Urinary frequency -     CBC with Differential/Platelet -     CMP14+EGFR -     PSA, total and free -     Lipid panel -  VITAMIN D 25 Hydroxy (Vit-D Deficiency, Fractures) -     Urinalysis -     Vibegron (GEMTESA) 75 MG TABS; Take 1 capsule by mouth daily.  Essential hypertension -     lisinopril (ZESTRIL) 20 MG tablet; Take 1 tablet (20 mg total) by mouth daily.  Screening for prostate cancer  Vitamin D deficiency  Encounter for screening for HIV -     HIV Antibody (routine testing w rflx)  Need for hepatitis C screening test -     Hepatitis C antibody  Screen for colon cancer -     Ambulatory referral to Gastroenterology  Other orders -     amLODipine (NORVASC) 5 MG tablet; Take 1 tablet (5 mg total) by mouth daily. For blood pressure       I have discontinued Jeneen Rinks T. Stanczak's Salicylic Acid, naproxen sodium, diltiazem, tiZANidine, Multiple Vitamins-Minerals (CENTRUM SILVER PO), and predniSONE. I am also having him start on amLODipine. Additionally, I am having him maintain his fluticasone, acetaminophen, Gemtesa, and lisinopril.  Allergies as of 12/01/2020      Reactions   Myrbetriq  [mirabegron] Other (See Comments)   Shoulder pain      Medication List       Accurate as of December 01, 2020 11:50 AM. If you have any questions, ask your nurse or doctor.        STOP taking these medications   CENTRUM SILVER PO Stopped by: Claretta Fraise, MD   diltiazem 360 MG 24 hr capsule Commonly known as: CARDIZEM CD Stopped by: Claretta Fraise, MD   naproxen sodium 220 MG tablet Commonly known as: ALEVE Stopped by: Claretta Fraise, MD   predniSONE 10 MG tablet Commonly known as: DELTASONE Stopped by: Claretta Fraise, MD   Salicylic Acid 6 % Sham Stopped by: Claretta Fraise, MD   tiZANidine 4 MG tablet Commonly known as: Zanaflex Stopped by: Claretta Fraise, MD     TAKE these medications   acetaminophen 500 MG tablet Commonly known as: TYLENOL Take 500 mg by mouth every 6 (six) hours as needed for moderate pain.   amLODipine 5 MG tablet Commonly known as: NORVASC Take 1 tablet (5 mg total) by mouth daily. For blood pressure Started by: Claretta Fraise, MD   fluticasone 50 MCG/ACT nasal spray Commonly known as: FLONASE Place 2 sprays into both nostrils daily.   Gemtesa 75 MG Tabs Generic drug: Vibegron Take 1 capsule by mouth daily.   lisinopril 20 MG tablet Commonly known as: ZESTRIL Take 1 tablet (20 mg total) by mouth daily.        Follow-up: Return in about 1 month (around 12/31/2020) for hypertension & urinary frequency.  Claretta Fraise, M.D.

## 2020-12-02 LAB — CMP14+EGFR
ALT: 18 IU/L (ref 0–44)
AST: 24 IU/L (ref 0–40)
Albumin/Globulin Ratio: 1.7 (ref 1.2–2.2)
Albumin: 4.8 g/dL (ref 4.0–5.0)
Alkaline Phosphatase: 84 IU/L (ref 44–121)
BUN/Creatinine Ratio: 9 (ref 9–20)
BUN: 12 mg/dL (ref 6–24)
Bilirubin Total: 1 mg/dL (ref 0.0–1.2)
CO2: 23 mmol/L (ref 20–29)
Calcium: 9.5 mg/dL (ref 8.7–10.2)
Chloride: 100 mmol/L (ref 96–106)
Creatinine, Ser: 1.31 mg/dL — ABNORMAL HIGH (ref 0.76–1.27)
Globulin, Total: 2.9 g/dL (ref 1.5–4.5)
Glucose: 86 mg/dL (ref 65–99)
Potassium: 4.6 mmol/L (ref 3.5–5.2)
Sodium: 140 mmol/L (ref 134–144)
Total Protein: 7.7 g/dL (ref 6.0–8.5)
eGFR: 67 mL/min/{1.73_m2} (ref >59)

## 2020-12-02 LAB — CBC WITH DIFFERENTIAL/PLATELET
Basophils Absolute: 0 10*3/uL (ref 0.0–0.2)
Basos: 1 %
EOS (ABSOLUTE): 0.1 10*3/uL (ref 0.0–0.4)
Eos: 2 %
Hematocrit: 46.2 % (ref 37.5–51.0)
Hemoglobin: 15.2 g/dL (ref 13.0–17.7)
Immature Grans (Abs): 0 10*3/uL (ref 0.0–0.1)
Immature Granulocytes: 0 %
Lymphocytes Absolute: 1.5 10*3/uL (ref 0.7–3.1)
Lymphs: 38 %
MCH: 26.5 pg — ABNORMAL LOW (ref 26.6–33.0)
MCHC: 32.9 g/dL (ref 31.5–35.7)
MCV: 81 fL (ref 79–97)
Monocytes Absolute: 0.2 10*3/uL (ref 0.1–0.9)
Monocytes: 6 %
Neutrophils Absolute: 2.1 10*3/uL (ref 1.4–7.0)
Neutrophils: 53 %
Platelets: 245 10*3/uL (ref 150–450)
RBC: 5.73 x10E6/uL (ref 4.14–5.80)
RDW: 14.4 % (ref 11.6–15.4)
WBC: 3.9 10*3/uL (ref 3.4–10.8)

## 2020-12-02 LAB — LIPID PANEL
Chol/HDL Ratio: 3.2 ratio (ref 0.0–5.0)
Cholesterol, Total: 122 mg/dL (ref 100–199)
HDL: 38 mg/dL — ABNORMAL LOW (ref >39)
LDL Chol Calc (NIH): 69 mg/dL (ref 0–99)
Triglycerides: 70 mg/dL (ref 0–149)
VLDL Cholesterol Cal: 15 mg/dL (ref 5–40)

## 2020-12-02 LAB — VITAMIN D 25 HYDROXY (VIT D DEFICIENCY, FRACTURES): Vit D, 25-Hydroxy: 14.9 ng/mL — ABNORMAL LOW (ref 30.0–100.0)

## 2020-12-02 LAB — PSA, TOTAL AND FREE
PSA, Free Pct: 28.5 %
PSA, Free: 1.11 ng/mL
Prostate Specific Ag, Serum: 3.9 ng/mL (ref 0.0–4.0)

## 2020-12-02 LAB — HEPATITIS C ANTIBODY: Hep C Virus Ab: 0.3 s/co ratio (ref 0.0–0.9)

## 2020-12-02 LAB — HIV ANTIBODY (ROUTINE TESTING W REFLEX): HIV Screen 4th Generation wRfx: NONREACTIVE

## 2020-12-02 NOTE — Progress Notes (Signed)
Dear Stephen Schwartz, Your Vitamin D is  low. You need a prescription strength supplement I will send that in for you. Nurse, if at all possible, could you send in a prescription for the patient for vitamin D 50,000 units, 1 p.o. weekly #13 with 3 refills? Many thanks, WS

## 2020-12-03 ENCOUNTER — Other Ambulatory Visit: Payer: Self-pay | Admitting: *Deleted

## 2020-12-03 MED ORDER — VITAMIN D (ERGOCALCIFEROL) 1.25 MG (50000 UNIT) PO CAPS: 50000.0000 [IU] | ORAL_CAPSULE | ORAL | 3 refills | Status: DC

## 2020-12-08 ENCOUNTER — Encounter: Payer: Self-pay | Admitting: Internal Medicine

## 2020-12-31 ENCOUNTER — Other Ambulatory Visit: Payer: Self-pay

## 2020-12-31 ENCOUNTER — Encounter: Payer: Self-pay | Admitting: Family Medicine

## 2020-12-31 ENCOUNTER — Ambulatory Visit: Payer: BC Managed Care – PPO | Admitting: Family Medicine

## 2020-12-31 VITALS — BP 134/78 | HR 69 | Temp 97.8°F | Ht 71.0 in | Wt 222.6 lb

## 2020-12-31 DIAGNOSIS — R350 Frequency of micturition: Secondary | ICD-10-CM | POA: Diagnosis not present

## 2020-12-31 DIAGNOSIS — I10 Essential (primary) hypertension: Secondary | ICD-10-CM

## 2020-12-31 DIAGNOSIS — N401 Enlarged prostate with lower urinary tract symptoms: Secondary | ICD-10-CM

## 2020-12-31 LAB — URINALYSIS
Bilirubin, UA: NEGATIVE
Glucose, UA: NEGATIVE
Ketones, UA: NEGATIVE
Leukocytes,UA: NEGATIVE
Nitrite, UA: NEGATIVE
Protein,UA: NEGATIVE
RBC, UA: NEGATIVE
Specific Gravity, UA: 1.02 (ref 1.005–1.030)
Urobilinogen, Ur: 0.2 mg/dL (ref 0.2–1.0)
pH, UA: 6 (ref 5.0–7.5)

## 2020-12-31 MED ORDER — TAMSULOSIN HCL 0.4 MG PO CAPS: 0.8000 mg | ORAL_CAPSULE | Freq: Every day | ORAL | 3 refills | Status: DC

## 2020-12-31 NOTE — Progress Notes (Signed)
Subjective:  Patient ID: Stephen Schwartz, male    DOB: 03/23/1971  Age: 50 y.o. MRN: [id number]  CC: Follow-up (Hypertension, urinary frequency)   HPI Stephen Schwartz presents for  follow-up of hypertension. Patient has no history of headache chest pain or shortness of breath or recent cough. Patient also denies symptoms of TIA such as focal numbness or weakness. Patient denies side effects from medication. One episode of dizziness last week. Working outside, [place]. Vision blurred briefly. Was warm that day. Okay , normal the next day.   Urinary frequency every 1.5 hours thriugh the day and 1-2 episodes a night of nicturia. Two or Three times a week.   History Stephen Schwartz has a past medical history of Hip pain, History of nephrolithiasis, Hypertension, Psoriasis, and Psoriasis.   Stephen Schwartz has no past surgical history on file.   His family history is not on file.Stephen Schwartz reports that Stephen Schwartz has never smoked. Stephen Schwartz has never used smokeless tobacco. Stephen Schwartz reports that Stephen Schwartz does not drink alcohol and does not use drugs.  Current Outpatient Medications on File Prior to Visit  Medication Sig Dispense Refill  . acetaminophen (TYLENOL) 500 MG tablet Take 500 mg by mouth every 6 (six) hours as needed for moderate pain.    Marland Kitchen amLODipine (NORVASC) 5 MG tablet Take 1 tablet (5 mg total) by mouth daily. For blood pressure 30 tablet 5  . fluticasone (FLONASE) 50 MCG/ACT nasal spray Place 2 sprays into both nostrils daily. 16 g 6  . lisinopril (ZESTRIL) 20 MG tablet Take 1 tablet (20 mg total) by mouth daily. 90 tablet 3  . Vibegron (GEMTESA) 75 MG TABS Take 1 capsule by mouth daily. 30 tablet 0  . Vitamin D, Ergocalciferol, (DRISDOL) 1.25 MG (50000 UNIT) CAPS capsule Take 1 capsule (50,000 Units total) by mouth every 7 (seven) days. 13 capsule 3   No current facility-administered medications on file prior to visit.    ROS Review of Systems  Constitutional: Negative for fever.  Respiratory: Negative for shortness of breath.    Cardiovascular: Negative for chest pain.  Genitourinary: Positive for frequency.  Musculoskeletal: Negative for arthralgias.  Skin: Negative for rash.    Objective:  BP 134/78   Pulse 69   Temp 97.8 F (36.6 C)   Ht 5\' 11"  (1.803 m)   Wt 222 lb 9.6 oz (101 kg)   SpO2 100%   BMI 31.05 kg/m   BP Readings from Last 3 Encounters:  12/31/20 134/78  12/01/20 (!) 154/103  08/17/20 (!) 171/117    Wt Readings from Last 3 Encounters:  12/31/20 222 lb 9.6 oz (101 kg)  12/01/20 224 lb 3.2 oz (101.7 kg)  08/17/20 222 lb (100.7 kg)     Physical Exam Vitals reviewed.  Constitutional:      Appearance: Stephen Schwartz is well-developed.  HENT:     Head: Normocephalic and atraumatic.     Right Ear: External ear normal.     Left Ear: External ear normal.     Mouth/Throat:     Pharynx: No oropharyngeal exudate or posterior oropharyngeal erythema.  Eyes:     Pupils: Pupils are equal, round, and reactive to light.  Cardiovascular:     Rate and Rhythm: Normal rate and regular rhythm.     Heart sounds: No murmur heard.   Pulmonary:     Effort: No respiratory distress.     Breath sounds: Normal breath sounds.  Musculoskeletal:     Cervical back: Normal range of motion and neck  supple.  Neurological:     Mental Status: Stephen Schwartz is alert and oriented to person, place, and time.       Assessment & Plan:   Stephen Schwartz was seen today for follow-up.  Diagnoses and all orders for this visit:  Benign prostatic hyperplasia with urinary frequency -     Urinalysis  Essential hypertension  Other orders -     tamsulosin (FLOMAX) 0.4 MG CAPS capsule; Take 2 capsules (0.8 mg total) by mouth at bedtime. For urine flow and prostate   Allergies as of 12/31/2020      Reactions   Myrbetriq [mirabegron] Other (See Comments)   Shoulder pain      Medication List       Accurate as of Dec 31, 2020  2:43 PM. If you have any questions, ask your nurse or doctor.        acetaminophen 500 MG tablet Commonly  known as: TYLENOL Take 500 mg by mouth every 6 (six) hours as needed for moderate pain.   amLODipine 5 MG tablet Commonly known as: NORVASC Take 1 tablet (5 mg total) by mouth daily. For blood pressure   fluticasone 50 MCG/ACT nasal spray Commonly known as: FLONASE Place 2 sprays into both nostrils daily.   Gemtesa 75 MG Tabs Generic drug: Vibegron Take 1 capsule by mouth daily.   lisinopril 20 MG tablet Commonly known as: ZESTRIL Take 1 tablet (20 mg total) by mouth daily.   tamsulosin 0.4 MG Caps capsule Commonly known as: FLOMAX Take 2 capsules (0.8 mg total) by mouth at bedtime. For urine flow and prostate Started by: Claretta Fraise, MD   Vitamin D (Ergocalciferol) 1.25 MG (50000 UNIT) Caps capsule Commonly known as: DRISDOL Take 1 capsule (50,000 Units total) by mouth every 7 (seven) days.       Meds ordered this encounter  Medications  . tamsulosin (FLOMAX) 0.4 MG CAPS capsule    Sig: Take 2 capsules (0.8 mg total) by mouth at bedtime. For urine flow and prostate    Dispense:  180 capsule    Refill:  3      Follow-up: Return in about 3 months (around 04/02/2021).  Claretta Fraise, M.D.

## 2021-01-08 ENCOUNTER — Ambulatory Visit (INDEPENDENT_AMBULATORY_CARE_PROVIDER_SITE_OTHER): Payer: BC Managed Care – PPO | Admitting: *Deleted

## 2021-01-08 ENCOUNTER — Other Ambulatory Visit: Payer: Self-pay

## 2021-01-08 VITALS — Ht 71.0 in | Wt 226.2 lb

## 2021-01-08 DIAGNOSIS — Z1211 Encounter for screening for malignant neoplasm of colon: Secondary | ICD-10-CM

## 2021-01-08 MED ORDER — CLENPIQ 10-3.5-12 MG-GM -GM/160ML PO SOLN: 1.0000 | Freq: Once | ORAL | 0 refills | Status: AC

## 2021-01-08 NOTE — Progress Notes (Addendum)
Gastroenterology Pre-Procedure Review  Request Date: 01/08/2021 Requesting Physician: Dr. Livia Snellen @ [facility], no previous TCS  PATIENT REVIEW QUESTIONS: The patient responded to the following health history questions as indicated:    1. Diabetes Melitis: no 2. Joint replacements in the past 12 months: no 3. Major health problems in the past 3 months: no 4. Has an artificial valve or MVP: no 5. Has a defibrillator: no 6. Has been advised in past to take antibiotics in advance of a procedure like teeth cleaning: no 7. Family history of colon cancer: no  8. Alcohol Use: no 9. Illicit drug Use: no 10. History of sleep apnea: no  11. History of coronary artery or other vascular stents placed within the last 12 months: no 12. History of any prior anesthesia complications: no 13. Body mass index is 31.55 kg/m.    MEDICATIONS & ALLERGIES:    Patient reports the following regarding taking any blood thinners:   Plavix? no Aspirin? no Coumadin? no Brilinta? no Xarelto? no Eliquis? no Pradaxa? no Savaysa? no Effient? no  Patient confirms/reports the following medications:  Current Outpatient Medications  Medication Sig Dispense Refill  . acetaminophen (TYLENOL) 500 MG tablet Take 500 mg by mouth as needed for moderate pain.    Marland Kitchen amLODipine (NORVASC) 5 MG tablet Take 1 tablet (5 mg total) by mouth daily. For blood pressure 30 tablet 5  . fluticasone (FLONASE) 50 MCG/ACT nasal spray Place 2 sprays into both nostrils daily. 16 g 6  . lisinopril (ZESTRIL) 20 MG tablet Take 1 tablet (20 mg total) by mouth daily. 90 tablet 3  . tamsulosin (FLOMAX) 0.4 MG CAPS capsule Take 2 capsules (0.8 mg total) by mouth at bedtime. For urine flow and prostate 180 capsule 3  . Vitamin D, Ergocalciferol, (DRISDOL) 1.25 MG (50000 UNIT) CAPS capsule Take 1 capsule (50,000 Units total) by mouth every 7 (seven) days. 13 capsule 3   No current facility-administered medications for this visit.    Patient  confirms/reports the following allergies:  Allergies  Allergen Reactions  . Myrbetriq [Mirabegron] Other (See Comments)    Shoulder pain     No orders of the defined types were placed in this encounter.   AUTHORIZATION INFORMATION Primary Insurance: BC[employer]  ID [id number],  Group #: [id number] Pre-Cert / Josem Kaufmann required: No, not required per [facility] Pre-Cert / Auth #: Ref: Deena 1:20 PM 01/14/2021  SCHEDULE INFORMATION: Procedure has been scheduled as follows:  Date: 02/09/2021, Time: 9:45 Location: [facility] with Dr. Abbey Chatters  This Gastroenterology Pre-Precedure Review Form is being routed to the following provider(s): Aliene Altes, PA

## 2021-01-08 NOTE — Patient Instructions (Signed)
[place]   Patient Name:  HARU SHAFF Date of procedure:  02/09/2021 Time to register at [facility]: 8:15 AM Provider:  Dr. Abbey Chatters  Please notify us immediately if you are diabetic, take iron supplements, or if you are on coumadin or any blood thinners.  Please hold the following medications: n/a  Note: Do NOT refrigerate or freeze CLENPIQ. CLENPIQ is ready to drink. There is no need to add any other liquid or mix the medicine in the bottle before you start dosing.   02/08/2021-  1 Day prior to procedure:     CLEAR LIQUIDS ALL DAY--NO SOLID FOODS OR DAIRY PRODUCTS! See list of liquids that are allowed and items that are NOT allowed below.   Diabetic Medication Instructions:  n/a   You must drink plenty of CLEAR LIQUIDS starting before your bowel prep. It is important to stay adequately hydrated before, during, and after your bowel prep for the prep to work effectively!   At 4:00 PM Begin the prep as follows:    1. Drink one bottle of pre-mixed CLENPIQ right from the bottle.  2. Drink at least five (5) 8-ounce drinks of clear liquids of your choice within the next 5 hours   At 10:00 PM: 1. Drink the second bottle of pre-mixed CLENPIQ right from the bottle.   2. Drink at least three (3) 8-ounce drinks of clear liquids of your choice within the next 3 hours before going to bed.   Continue clear liquids.    02/09/2021-  Day of Procedure:   Diabetic medications adjustments: n/a   You may take TYLENOL products.  Please continue your regular medications unless we have instructed you otherwise.     At 3 hours before procedure @ 6:45 am: Stop drinking all liquids, nothing by mouth at this point.   Please note, on the day of your procedure you MUST be accompanied by an adult who is willing to assume responsibility for you at time of discharge. If you do not have such person with you, your procedure will have to be rescheduled.                                                                                                                      Please leave ALL jewelry at home prior to coming to the hospital for your procedure.   *It is your responsibility to check with your insurance company for the benefits of coverage you have for this procedure. Unfortunately, not all insurance companies have benefits to cover all or part of these types of procedures. It is your responsibility to check your benefits, however we will be glad to assist you with any codes your insurance company may need.   Please note that most insurance companies will not cover a screening colonoscopy for people under the age of 67  For example, with some insurance companies you may have benefits for a screening colonoscopy, but if polyps are found the diagnosis will change and  then you may have a deductible that will need to be met. Please make sure you check your benefits for screening colonoscopy as well as a diagnostic colonoscopy.    CLEAR LIQUIDS: (NO RED or PURPLE) Water  Jello   Apple Juice  White Grape Juice   Kool-Aid Soft drinks  Banana popsicles Sports Drink  Black coffee (No cream or milk) Tea (No cream or milk)  Broth (fat free beef/chicken/vegetable)  Clear liquids allow you to see your fingers on the other side of the glass.  Be sure they are NOT RED or PURPLE in color, cloudy, but CLEAR.  Do Not Eat: Dairy products of any kind Cranberry juice Tomato or V8 Juice  Orange Juice   Grapefruit Juice Red Grape Juice Alcohol   Non-dairy creamer Solid foods like cereal, oatmeal, yogurt, fruits, vegetables, creamed soups, eggs, bread, etc   HELPFUL HINTS TO MAKE DRINKING EASIER: -Trying drinking through a straw. -If you become nauseated, try consuming smaller amounts or stretch out the time between glasses.  Stop for 30 minutes & slowly start back drinking.  Call our office with any questions or concerns at [phone].  Thank You,  Christ Kick, [place]

## 2021-01-09 NOTE — Progress Notes (Signed)
OK to schedule. ASA II 

## 2021-01-14 ENCOUNTER — Other Ambulatory Visit: Payer: Self-pay | Admitting: *Deleted

## 2021-01-14 NOTE — Progress Notes (Signed)
Called [employer].  Spoke to [place].  He informed me the procedures are covered at age 50.

## 2021-02-09 ENCOUNTER — Other Ambulatory Visit: Payer: Self-pay

## 2021-02-09 ENCOUNTER — Encounter (HOSPITAL_COMMUNITY): Payer: Self-pay

## 2021-02-09 ENCOUNTER — Ambulatory Visit (HOSPITAL_COMMUNITY): Payer: BC Managed Care – PPO | Admitting: Anesthesiology

## 2021-02-09 ENCOUNTER — Ambulatory Visit (HOSPITAL_COMMUNITY)
Admission: RE | Admit: 2021-02-09 | Discharge: 2021-02-09 | Disposition: A | Discharge status: Home / Self Care | Payer: BC Managed Care – PPO | Attending: Internal Medicine | Admitting: Internal Medicine

## 2021-02-09 ENCOUNTER — Encounter (HOSPITAL_COMMUNITY)
Admission: RE | Disposition: A | Discharge status: Home / Self Care | Payer: Self-pay | Source: Home / Self Care | Attending: Internal Medicine

## 2021-02-09 DIAGNOSIS — Z87442 Personal history of urinary calculi: Secondary | ICD-10-CM | POA: Diagnosis not present

## 2021-02-09 DIAGNOSIS — Z888 Allergy status to other drugs, medicaments and biological substances status: Secondary | ICD-10-CM | POA: Insufficient documentation

## 2021-02-09 DIAGNOSIS — Z800 Family history of malignant neoplasm of digestive organs: Secondary | ICD-10-CM | POA: Insufficient documentation

## 2021-02-09 DIAGNOSIS — K635 Polyp of colon: Secondary | ICD-10-CM

## 2021-02-09 DIAGNOSIS — Z79899 Other long term (current) drug therapy: Secondary | ICD-10-CM | POA: Diagnosis not present

## 2021-02-09 DIAGNOSIS — K5730 Diverticulosis of large intestine without perforation or abscess without bleeding: Secondary | ICD-10-CM | POA: Diagnosis not present

## 2021-02-09 DIAGNOSIS — K648 Other hemorrhoids: Secondary | ICD-10-CM | POA: Diagnosis not present

## 2021-02-09 DIAGNOSIS — D123 Benign neoplasm of transverse colon: Secondary | ICD-10-CM | POA: Diagnosis not present

## 2021-02-09 DIAGNOSIS — Z1211 Encounter for screening for malignant neoplasm of colon: Secondary | ICD-10-CM | POA: Diagnosis present

## 2021-02-09 HISTORY — PX: POLYPECTOMY: SHX5525

## 2021-02-09 HISTORY — PX: COLONOSCOPY WITH PROPOFOL: SHX5780

## 2021-02-09 SURGERY — COLONOSCOPY WITH PROPOFOL
Anesthesia: General

## 2021-02-09 MED ORDER — STERILE WATER FOR IRRIGATION IR SOLN
Status: DC | PRN
  Administered 2021-02-09: 1.5 mL

## 2021-02-09 MED ORDER — PROPOFOL 500 MG/50ML IV EMUL
INTRAVENOUS | Status: DC | PRN
  Administered 2021-02-09: 150 ug/kg/min via INTRAVENOUS

## 2021-02-09 MED ORDER — LACTATED RINGERS IV SOLN: INTRAVENOUS | Status: DC

## 2021-02-09 MED ORDER — PROPOFOL 10 MG/ML IV BOLUS
INTRAVENOUS | Status: DC | PRN
  Administered 2021-02-09: 100 mg via INTRAVENOUS

## 2021-02-09 NOTE — Anesthesia Preprocedure Evaluation (Addendum)
Anesthesia Evaluation  Patient identified by MRN, date of birth, ID band Patient awake    Reviewed: Allergy & Precautions, NPO status , Patient's Chart, lab work & pertinent test results  History of Anesthesia Complications Negative for: history of anesthetic complications  Airway Mallampati: II  TM Distance: >3 FB Neck ROM: Full    Dental  (+) Dental Advisory Given, Missing   Pulmonary neg pulmonary ROS,    Pulmonary exam normal breath sounds clear to auscultation       Cardiovascular Exercise Tolerance: Good hypertension, Pt. on medications Normal cardiovascular exam Rhythm:Regular Rate:Normal     Neuro/Psych negative neurological ROS  negative psych ROS   GI/Hepatic negative GI ROS, Neg liver ROS,   Endo/Other  negative endocrine ROS  Renal/GU Renal disease (stones)     Musculoskeletal  (+) Arthritis ,   Abdominal   Peds  Hematology   Anesthesia Other Findings psoriasis - skin  Reproductive/Obstetrics                          Anesthesia Physical Anesthesia Plan  ASA: 2  Anesthesia Plan: General   Post-op Pain Management:    Induction: Intravenous  PONV Risk Score and Plan: Propofol infusion  Airway Management Planned: Nasal Cannula and Natural Airway  Additional Equipment:   Intra-op Plan:   Post-operative Plan:   Informed Consent: I have reviewed the patients History and Physical, chart, labs and discussed the procedure including the risks, benefits and alternatives for the proposed anesthesia with the patient or authorized representative who has indicated his/her understanding and acceptance.     Dental advisory given  Plan Discussed with: CRNA and Surgeon  Anesthesia Plan Comments:         Anesthesia Quick Evaluation

## 2021-02-09 NOTE — H&P (Signed)
Primary Care Physician:  Claretta Fraise, MD Primary Gastroenterologist:  Dr. Abbey Chatters  Pre-Procedure History & Physical: HPI:  Stephen Schwartz is a 50 y.o. male is here for a colonoscopy for colon cancer screening purposes.  Patient denies any family history of colorectal cancer.  No melena or hematochezia.  No abdominal pain or unintentional weight loss.  No change in bowel habits.  Overall feels well from a GI standpoint.  Past Medical History:  Diagnosis Date   Hip pain    History of nephrolithiasis    Hypertension    Psoriasis    Psoriasis     Past Surgical History:  Procedure Laterality Date   NO PAST SURGERIES      Prior to Admission medications   Medication Sig Start Date End Date Taking? Authorizing Provider  acetaminophen (TYLENOL) 500 MG tablet Take 500 mg by mouth as needed for moderate pain.   Yes [provider]  amLODipine (NORVASC) 5 MG tablet Take 1 tablet (5 mg total) by mouth daily. For blood pressure 12/01/20  Yes Stacks, Cletus Gash, MD  lisinopril (ZESTRIL) 20 MG tablet Take 1 tablet (20 mg total) by mouth daily. 12/01/20  Yes Claretta Fraise, MD  Pseudoeph-Doxylamine-DM-APAP (DAYQUIL/NYQUIL COLD/FLU RELIEF PO) Take by mouth.   Yes [provider]  tamsulosin (FLOMAX) 0.4 MG CAPS capsule Take 2 capsules (0.8 mg total) by mouth at bedtime. For urine flow and prostate 12/31/20  Yes Stacks, Cletus Gash, MD  Vitamin D, Ergocalciferol, (DRISDOL) 1.25 MG (50000 UNIT) CAPS capsule Take 1 capsule (50,000 Units total) by mouth every 7 (seven) days. 12/03/20  Yes Stacks, Cletus Gash, MD  fluticasone (FLONASE) 50 MCG/ACT nasal spray Place 2 sprays into both nostrils daily. 01/04/19   Sharion Balloon, FNP    Allergies as of 01/09/2021 - Review Complete 01/08/2021  Allergen Reaction Noted   Myrbetriq [mirabegron] Other (See Comments) 08/17/2020    Family History  Problem Relation Age of Onset   Colon cancer Paternal Grandfather     Social History   Socioeconomic History    Marital status: Married    Spouse name: Not on file   Number of children: Not on file   Years of education: Not on file   Highest education level: Not on file  Occupational History   Occupation: truck driver  Tobacco Use   Smoking status: Never   Smokeless tobacco: Never  Vaping Use   Vaping Use: Never used  Substance and Sexual Activity   Alcohol use: No   Drug use: No   Sexual activity: Not on file  Other Topics Concern   Not on file  Social History Narrative   Not on file   Social Determinants of Health   Financial Resource Strain: Not on file  Food Insecurity: Not on file  Transportation Needs: Not on file  Physical Activity: Not on file  Stress: Not on file  Social Connections: Not on file  Intimate Partner Violence: Not on file    Review of Systems: See HPI, otherwise negative ROS  Physical Exam: Vital signs in last 24 hours: Temp:  [98.1 F (36.7 C)] 98.1 F (36.7 C) (06/27 0820) Pulse Rate:  [62] 62 (06/27 0820) Resp:  [16] 16 (06/27 0820) BP: (162)/(91) 162/91 (06/27 0820) SpO2:  [98 %] 98 % (06/27 0820) Weight:  [98 kg] 98 kg (06/27 0820)   General:   Alert,  Well-developed, well-nourished, pleasant and cooperative in NAD Head:  Normocephalic and atraumatic. Eyes:  Sclera clear, no icterus.   Conjunctiva  pink. Ears:  Normal auditory acuity. Nose:  No deformity, discharge,  or lesions. Mouth:  No deformity or lesions, dentition normal. Neck:  Supple; no masses or thyromegaly. Lungs:  Clear throughout to auscultation.   No wheezes, crackles, or rhonchi. No acute distress. Heart:  Regular rate and rhythm; no murmurs, clicks, rubs,  or gallops. Abdomen:  Soft, nontender and nondistended. No masses, hepatosplenomegaly or hernias noted. Normal bowel sounds, without guarding, and without rebound.   Msk:  Symmetrical without gross deformities. Normal posture. Extremities:  Without clubbing or edema. Neurologic:  Alert and  oriented x4;  grossly normal  neurologically. Skin:  Intact without significant lesions or rashes. Cervical Nodes:  No significant cervical adenopathy. Psych:  Alert and cooperative. Normal mood and affect.  Impression/Plan: Stephen Schwartz is here for a colonoscopy to be performed for colon cancer screening purposes.  The risks of the procedure including infection, bleed, or perforation as well as benefits, limitations, alternatives and imponderables have been reviewed with the patient. Questions have been answered. All parties agreeable.

## 2021-02-09 NOTE — Transfer of Care (Signed)
Immediate Anesthesia Transfer of Care Note  Patient: Stephen Schwartz  Procedure(s) Performed: COLONOSCOPY WITH PROPOFOL POLYPECTOMY  Patient Location: Endoscopy Unit  Anesthesia Type:General  Level of Consciousness: drowsy  Airway & Oxygen Therapy: Patient Spontanous Breathing  Post-op Assessment: Report given to RN and Post -op Vital signs reviewed and stable  Post vital signs: Reviewed and stable  Last Vitals:  Vitals Value Taken Time  BP    Temp    Pulse    Resp    SpO2      Last Pain:  Vitals:   02/09/21 0904  TempSrc:   PainSc: 0-No pain      Patients Stated Pain Goal: 8 (70/26/37 8588)  Complications: No notable events documented.

## 2021-02-09 NOTE — Anesthesia Postprocedure Evaluation (Signed)
Anesthesia Post Note  Patient: Stephen Schwartz  Procedure(s) Performed: COLONOSCOPY WITH PROPOFOL POLYPECTOMY  Patient location during evaluation: Endoscopy Anesthesia Type: General Level of consciousness: awake and alert and oriented Pain management: pain level controlled Vital Signs Assessment: post-procedure vital signs reviewed and stable Respiratory status: spontaneous breathing and respiratory function stable Cardiovascular status: blood pressure returned to baseline and stable Postop Assessment: no apparent nausea or vomiting Anesthetic complications: no   No notable events documented.   Last Vitals:  Vitals:   02/09/21 0926 02/09/21 0931  BP: (!) 90/50 111/70  Pulse: 63 65  Resp: 20 20  Temp: 36.4 C   SpO2: 93% 93%    Last Pain:  Vitals:   02/09/21 0931  TempSrc:   PainSc: 0-No pain                 Keosha Rossa C Morley Gaumer

## 2021-02-09 NOTE — Op Note (Signed)
[facility] Patient Name: Stephen Schwartz Procedure Date: 02/09/2021 8:57 AM MRN: [id number] Date of Birth: 17-Aug-1970 Attending MD: Elon Alas. Edgar Frisk CSN: [id number] Age: 50 Admit Type: Outpatient Procedure:                Colonoscopy Indications:              Screening for colorectal malignant neoplasm Providers:                Elon Alas. Abbey Chatters, DO, Caprice Kluver, Raphael Gibney,                            Technician Referring MD:              Medicines:                See the Anesthesia note for documentation of the                            administered medications Complications:            No immediate complications. Estimated Blood Loss:     Estimated blood loss was minimal. Procedure:                Pre-Anesthesia Assessment:                           - The anesthesia plan was to use monitored                            anesthesia care (MAC).                           After obtaining informed consent, the colonoscope                            was passed under direct vision. Throughout the                            procedure, the patient's blood pressure, pulse, and                            oxygen saturations were monitored continuously. The                            PCF-H190DL ([id number]) scope was introduced through                            the anus and advanced to the the cecum, identified                            by appendiceal orifice and ileocecal valve. The                            colonoscopy was performed without difficulty. The                            patient tolerated the procedure well. The quality  of the bowel preparation was evaluated using the                            BBPS [facility] Bowel Preparation Scale) with scores                            of: Right Colon = 3, Transverse Colon = 3 and Left                            Colon = 3 (entire mucosa seen well with no residual                            staining, small  fragments of stool or opaque                            liquid). The total BBPS score equals 9. Scope In: 9:09:21 AM Scope Out: 9:22:41 AM Scope Withdrawal Time: 0 hours 8 minutes 26 seconds  Total Procedure Duration: 0 hours 13 minutes 20 seconds  Findings:      The perianal and digital rectal examinations were normal.      Non-bleeding internal hemorrhoids were found during endoscopy.      Scattered small-mouthed diverticula were found in the entire colon.      A 4 mm polyp was found in the transverse colon. The polyp was sessile.       The polyp was removed with a cold snare. Resection and retrieval were       complete.      The exam was otherwise without abnormality. Impression:               - Non-bleeding internal hemorrhoids.                           - Diverticulosis in the entire examined colon.                           - One 4 mm polyp in the transverse colon, removed                            with a cold snare. Resected and retrieved.                           - The examination was otherwise normal. Moderate Sedation:      Per Anesthesia Care Recommendation:           - Patient has a contact number available for                            emergencies. The signs and symptoms of potential                            delayed complications were discussed with the                            patient. Return to normal activities tomorrow.  Written discharge instructions were provided to the                            patient.                           - Resume previous diet.                           - Continue present medications.                           - Await pathology results.                           - Repeat colonoscopy in 5 years for surveillance.                           - Return to GI clinic PRN. Procedure Code(s):        --- Professional ---                           [phone], Colonoscopy, flexible; with removal of                             tumor(s), polyp(s), or other lesion(s) by snare                            technique Diagnosis Code(s):        --- Professional ---                           Z12.11, Encounter for screening for malignant                            neoplasm of colon                           K64.8, Other hemorrhoids                           K63.5, Polyp of colon                           K57.30, Diverticulosis of large intestine without                            perforation or abscess without bleeding CPT copyright 2019 American Medical Association. All rights reserved. The codes documented in this report are preliminary and upon coder review may  be revised to meet current compliance requirements. Elon Alas. Abbey Chatters, DO [place] Abbey Chatters, DO 02/09/2021 9:25:16 AM This report has been signed electronically. Number of Addenda: 0

## 2021-02-09 NOTE — Discharge Instructions (Addendum)
  Colonoscopy Discharge Instructions  Read the instructions outlined below and refer to this sheet in the next few weeks. These discharge instructions provide you with general information on caring for yourself after you leave the hospital. Your doctor may also give you specific instructions. While your treatment has been planned according to the most current medical practices available, unavoidable complications occasionally occur.   ACTIVITY You may resume your regular activity, but move at a slower pace for the next 24 hours.  Take frequent rest periods for the next 24 hours.  Walking will help get rid of the air and reduce the bloated feeling in your belly (abdomen).  No driving for 24 hours (because of the medicine (anesthesia) used during the test).   Do not sign any important legal documents or operate any machinery for 24 hours (because of the anesthesia used during the test).  NUTRITION Drink plenty of fluids.  You may resume your normal diet as instructed by your doctor.  Begin with a light meal and progress to your normal diet. Heavy or fried foods are harder to digest and may make you feel sick to your stomach (nauseated).  Avoid alcoholic beverages for 24 hours or as instructed.  MEDICATIONS You may resume your normal medications unless your doctor tells you otherwise.  WHAT YOU CAN EXPECT TODAY Some feelings of bloating in the abdomen.  Passage of more gas than usual.  Spotting of blood in your stool or on the toilet paper.  IF YOU HAD POLYPS REMOVED DURING THE COLONOSCOPY: No aspirin products for 7 days or as instructed.  No alcohol for 7 days or as instructed.  Eat a soft diet for the next 24 hours.  FINDING OUT THE RESULTS OF YOUR TEST Not all test results are available during your visit. If your test results are not back during the visit, make an appointment with your caregiver to find out the results. Do not assume everything is normal if you have not heard from your  caregiver or the medical facility. It is important for you to follow up on all of your test results.  SEEK IMMEDIATE MEDICAL ATTENTION IF: You have more than a spotting of blood in your stool.  Your belly is swollen (abdominal distention).  You are nauseated or vomiting.  You have a temperature over 101.  You have abdominal pain or discomfort that is severe or gets worse throughout the day.   Your colonoscopy revealed 1 polyp(s) which I removed successfully. Await pathology results, my office will contact you. I recommend repeating colonoscopy in 5-10 years for surveillance purposes depending on pathology. You also have diverticulosis and internal hemorrhoids. I would recommend increasing fiber in your diet or adding OTC Benefiber/Metamucil. Be sure to drink at least 4 to 6 glasses of water daily. Follow-up with GI as needed.    I hope you have a great rest of your week!  Elon Alas. Abbey Chatters, D.O. Gastroenterology and Hepatology [facility]

## 2021-02-10 LAB — SURGICAL PATHOLOGY

## 2021-02-17 ENCOUNTER — Encounter (HOSPITAL_COMMUNITY): Payer: Self-pay | Admitting: Internal Medicine

## 2021-05-11 ENCOUNTER — Telehealth: Payer: Self-pay | Admitting: Family Medicine

## 2021-05-11 NOTE — Telephone Encounter (Signed)
  Prescription Request  05/11/2021  Is this a "Controlled Substance" medicine? no Have you seen your PCP in the last 2 weeks? Pt has appt 05/15/21  If YES, route message to pool  -  If NO, patient needs to be scheduled for appointment.  What is the name of the medication or equipment?amlodipine and lisinopril  Have you contacted your pharmacy to request a refill?   Which pharmacy [facility] you [facility] sent [facility]? [facility]   Patient notified that their request is being sent to the clinical staff for review and that they should receive a response within 2 business days.

## 2021-05-15 ENCOUNTER — Ambulatory Visit: Payer: BC Managed Care – PPO | Admitting: Family Medicine

## 2021-05-15 ENCOUNTER — Telehealth: Payer: Self-pay | Admitting: Family Medicine

## 2021-05-15 DIAGNOSIS — I10 Essential (primary) hypertension: Secondary | ICD-10-CM

## 2021-05-15 MED ORDER — AMLODIPINE BESYLATE 5 MG PO TABS: 5.0000 mg | ORAL_TABLET | Freq: Every day | ORAL | 0 refills | Status: DC

## 2021-05-15 MED ORDER — LISINOPRIL 20 MG PO TABS: 20.0000 mg | ORAL_TABLET | Freq: Every day | ORAL | 0 refills | Status: DC

## 2021-05-15 NOTE — Telephone Encounter (Signed)
  Prescription Request  05/15/2021  Is this a "Controlled Substance" medicine? no Have you seen your PCP in the last 2 weeks? no If YES, route message to pool  -  If NO, patient needs to be scheduled for appointment.  What is the name of the medication or equipment? amLODipine (NORVASC) 5 MG tablet and lisinopril (ZESTRIL) 20 MG tablet  Have you contacted your pharmacy to request a refill? yes  Which pharmacy would you like this sent to? [facility] in [place]    Patient notified that their request is being sent to the clinical staff for review and that they should receive a response within 2 business days.

## 2021-05-15 NOTE — Telephone Encounter (Signed)
LMOVM refills sent to [facility]

## 2021-06-23 ENCOUNTER — Ambulatory Visit: Payer: BC Managed Care – PPO | Admitting: Family Medicine

## 2021-07-06 ENCOUNTER — Encounter: Payer: Self-pay | Admitting: Family Medicine

## 2021-07-06 ENCOUNTER — Ambulatory Visit: Payer: BC Managed Care – PPO | Admitting: Family Medicine

## 2021-07-06 ENCOUNTER — Other Ambulatory Visit: Payer: Self-pay

## 2021-07-06 VITALS — BP 146/87 | HR 70 | Temp 97.3°F | Ht 71.0 in | Wt 222.4 lb

## 2021-07-06 DIAGNOSIS — E785 Hyperlipidemia, unspecified: Secondary | ICD-10-CM | POA: Diagnosis not present

## 2021-07-06 DIAGNOSIS — I10 Essential (primary) hypertension: Secondary | ICD-10-CM

## 2021-07-06 DIAGNOSIS — R350 Frequency of micturition: Secondary | ICD-10-CM | POA: Diagnosis not present

## 2021-07-06 LAB — CBC WITH DIFFERENTIAL/PLATELET
Basophils Absolute: 0 10*3/uL (ref 0.0–0.2)
Basos: 1 %
EOS (ABSOLUTE): 0.1 10*3/uL (ref 0.0–0.4)
Eos: 4 %
Hematocrit: 45.4 % (ref 37.5–51.0)
Hemoglobin: 14.6 g/dL (ref 13.0–17.7)
Immature Grans (Abs): 0 10*3/uL (ref 0.0–0.1)
Immature Granulocytes: 0 %
Lymphocytes Absolute: 1.9 10*3/uL (ref 0.7–3.1)
Lymphs: 47 %
MCH: 25.6 pg — ABNORMAL LOW (ref 26.6–33.0)
MCHC: 32.2 g/dL (ref 31.5–35.7)
MCV: 80 fL (ref 79–97)
Monocytes Absolute: 0.3 10*3/uL (ref 0.1–0.9)
Monocytes: 7 %
Neutrophils Absolute: 1.6 10*3/uL (ref 1.4–7.0)
Neutrophils: 41 %
Platelets: 274 10*3/uL (ref 150–450)
RBC: 5.71 x10E6/uL (ref 4.14–5.80)
RDW: 14.3 % (ref 11.6–15.4)
WBC: 4 10*3/uL (ref 3.4–10.8)

## 2021-07-06 LAB — CMP14+EGFR
ALT: 18 IU/L (ref 0–44)
AST: 15 IU/L (ref 0–40)
Albumin/Globulin Ratio: 1.9 (ref 1.2–2.2)
Albumin: 4.7 g/dL (ref 4.0–5.0)
Alkaline Phosphatase: 76 IU/L (ref 44–121)
BUN/Creatinine Ratio: 11 (ref 9–20)
BUN: 13 mg/dL (ref 6–24)
Bilirubin Total: 0.7 mg/dL (ref 0.0–1.2)
CO2: 25 mmol/L (ref 20–29)
Calcium: 9.1 mg/dL (ref 8.7–10.2)
Chloride: 103 mmol/L (ref 96–106)
Creatinine, Ser: 1.21 mg/dL (ref 0.76–1.27)
Globulin, Total: 2.5 g/dL (ref 1.5–4.5)
Glucose: 96 mg/dL (ref 70–99)
Potassium: 4.3 mmol/L (ref 3.5–5.2)
Sodium: 141 mmol/L (ref 134–144)
Total Protein: 7.2 g/dL (ref 6.0–8.5)
eGFR: 73 mL/min/{1.73_m2} (ref >59)

## 2021-07-06 LAB — LIPID PANEL
Chol/HDL Ratio: 3.7 ratio (ref 0.0–5.0)
Cholesterol, Total: 137 mg/dL (ref 100–199)
HDL: 37 mg/dL — ABNORMAL LOW (ref >39)
LDL Chol Calc (NIH): 80 mg/dL (ref 0–99)
Triglycerides: 106 mg/dL (ref 0–149)
VLDL Cholesterol Cal: 20 mg/dL (ref 5–40)

## 2021-07-06 MED ORDER — AMLODIPINE BESYLATE 10 MG PO TABS: 10.0000 mg | ORAL_TABLET | Freq: Every day | ORAL | 3 refills | Status: DC

## 2021-07-06 MED ORDER — AMLODIPINE BESYLATE 5 MG PO TABS: 5.0000 mg | ORAL_TABLET | Freq: Every day | ORAL | 2 refills | Status: DC

## 2021-07-06 MED ORDER — GEMTESA 75 MG PO TABS: 1.0000 | ORAL_TABLET | Freq: Every day | ORAL | 3 refills | Status: DC

## 2021-07-06 MED ORDER — LISINOPRIL 20 MG PO TABS: 20.0000 mg | ORAL_TABLET | Freq: Every day | ORAL | 2 refills | Status: DC

## 2021-07-06 NOTE — Progress Notes (Signed)
Subjective:  Patient ID: Stephen Schwartz, male    DOB: February 26, 1971  Age: 50 y.o. MRN: [id number]  CC: Medical Management of Chronic Issues   HPI Stephen Schwartz presents for  follow-up of hypertension. Patient has no history of headache chest pain or shortness of breath or recent cough. Patient also denies symptoms of TIA such as focal numbness or weakness. Patient denies side effects from medication. States taking it regularly.   Tamsulosin caused E.D. and myrbetriq caused chest pain. Pt. Did best with vibegron.   History Stephen Schwartz has a past medical history of Hip pain, History of nephrolithiasis, Hypertension, Psoriasis, and Psoriasis.   Stephen Schwartz has a past surgical history that includes No past surgeries; Colonoscopy with propofol (N/A, 02/09/2021); and polypectomy (02/09/2021).   His family history includes Colon cancer in his paternal grandfather.Stephen Schwartz reports that Stephen Schwartz has never smoked. Stephen Schwartz has never used smokeless tobacco. Stephen Schwartz reports that Stephen Schwartz does not drink alcohol and does not use drugs.  Current Outpatient Medications on File Prior to Visit  Medication Sig Dispense Refill   acetaminophen (TYLENOL) 500 MG tablet Take 500 mg by mouth as needed for moderate pain.     fluticasone (FLONASE) 50 MCG/ACT nasal spray Place 2 sprays into both nostrils daily. 16 g 6   Pseudoeph-Doxylamine-DM-APAP (DAYQUIL/NYQUIL COLD/FLU RELIEF PO) Take by mouth.     Vitamin D, Ergocalciferol, (DRISDOL) 1.25 MG (50000 UNIT) CAPS capsule Take 1 capsule (50,000 Units total) by mouth every 7 (seven) days. 13 capsule 3   No current facility-administered medications on file prior to visit.    ROS Review of Systems  Constitutional:  Negative for fever.  Respiratory:  Negative for shortness of breath.   Cardiovascular:  Negative for chest pain.  Musculoskeletal:  Negative for arthralgias.  Skin:  Negative for rash.   Objective:  BP (!) 146/87   Pulse 70   Temp (!) 97.3 F (36.3 C)   Ht '5\' 11"'  (1.803 m)   Wt 222 lb 6.4  oz (100.9 kg)   SpO2 97%   BMI 31.02 kg/m   BP Readings from Last 3 Encounters:  07/06/21 (!) 146/87  02/09/21 111/70  12/31/20 134/78    Wt Readings from Last 3 Encounters:  07/06/21 222 lb 6.4 oz (100.9 kg)  02/09/21 216 lb (98 kg)  01/08/21 226 lb 3.2 oz (102.6 kg)     Physical Exam Vitals reviewed.  Constitutional:      Appearance: Stephen Schwartz is well-developed.  HENT:     Head: Normocephalic and atraumatic.     Right Ear: External ear normal.     Left Ear: External ear normal.     Mouth/Throat:     Pharynx: No oropharyngeal exudate or posterior oropharyngeal erythema.  Eyes:     Pupils: Pupils are equal, round, and reactive to light.  Cardiovascular:     Rate and Rhythm: Normal rate and regular rhythm.     Heart sounds: No murmur heard. Pulmonary:     Effort: No respiratory distress.     Breath sounds: Normal breath sounds.  Abdominal:     Tenderness: There is no abdominal tenderness.  Musculoskeletal:     Cervical back: Normal range of motion and neck supple.  Neurological:     Mental Status: Stephen Schwartz is alert and oriented to person, place, and time.      Assessment & Plan:   Shine was seen today for medical management of chronic issues.  Diagnoses and all orders for this visit:  Essential hypertension -  CBC with Differential/Platelet -     CMP14+EGFR -     lisinopril (ZESTRIL) 20 MG tablet; Take 1 tablet (20 mg total) by mouth daily.  Hyperlipidemia, unspecified hyperlipidemia type -     Lipid panel  Urinary frequency -     Vibegron (GEMTESA) 75 MG TABS; Take 1 capsule by mouth daily.  Other orders -     Discontinue: amLODipine (NORVASC) 5 MG tablet; Take 1 tablet (5 mg total) by mouth daily. For blood pressure -     amLODipine (NORVASC) 10 MG tablet; Take 1 tablet (10 mg total) by mouth daily. For blood pressure  Allergies as of 07/06/2021       Reactions   Myrbetriq [mirabegron] Other (See Comments)   Shoulder pain        Medication List         Accurate as of July 06, 2021  8:26 AM. If you have any questions, ask your nurse or doctor.          STOP taking these medications    tamsulosin 0.4 MG Caps capsule Commonly known as: FLOMAX Stopped by: Claretta Fraise, MD       TAKE these medications    acetaminophen 500 MG tablet Commonly known as: TYLENOL Take 500 mg by mouth as needed for moderate pain.   amLODipine 10 MG tablet Commonly known as: NORVASC Take 1 tablet (10 mg total) by mouth daily. For blood pressure What changed:  medication strength how much to take Changed by: Claretta Fraise, MD   DAYQUIL/NYQUIL COLD/FLU RELIEF PO Take by mouth.   fluticasone 50 MCG/ACT nasal spray Commonly known as: FLONASE Place 2 sprays into both nostrils daily.   Gemtesa 75 MG Tabs Generic drug: Vibegron Take 1 capsule by mouth daily. Started by: Claretta Fraise, MD   lisinopril 20 MG tablet Commonly known as: ZESTRIL Take 1 tablet (20 mg total) by mouth daily.   Vitamin D (Ergocalciferol) 1.25 MG (50000 UNIT) Caps capsule Commonly known as: DRISDOL Take 1 capsule (50,000 Units total) by mouth every 7 (seven) days.        Meds ordered this encounter  Medications   DISCONTD: amLODipine (NORVASC) 5 MG tablet    Sig: Take 1 tablet (5 mg total) by mouth daily. For blood pressure    Dispense:  90 tablet    Refill:  2   lisinopril (ZESTRIL) 20 MG tablet    Sig: Take 1 tablet (20 mg total) by mouth daily.    Dispense:  90 tablet    Refill:  2   Vibegron (GEMTESA) 75 MG TABS    Sig: Take 1 capsule by mouth daily.    Dispense:  90 tablet    Refill:  3   amLODipine (NORVASC) 10 MG tablet    Sig: Take 1 tablet (10 mg total) by mouth daily. For blood pressure    Dispense:  90 tablet    Refill:  3    Please cancel scrip for amlodipine 5 mg daily      Follow-up: Return in about 6 months (around 01/03/2022) for Compete physical, also sooner apt for prcedure - mole removal.  Claretta Fraise, M.D.

## 2021-07-07 NOTE — Progress Notes (Signed)
Hello Stephen Schwartz,  Your lab result is normal and/or stable.Some minor variations that are not significant are commonly marked abnormal, but do not represent any medical problem for you.  Best regards, Parris Signer, M.D.

## 2021-07-08 ENCOUNTER — Ambulatory Visit: Payer: BC Managed Care – PPO | Admitting: Family Medicine

## 2021-07-08 ENCOUNTER — Other Ambulatory Visit: Payer: Self-pay

## 2021-07-08 ENCOUNTER — Encounter: Payer: Self-pay | Admitting: Family Medicine

## 2021-07-08 VITALS — BP 140/88 | HR 63 | Temp 96.9°F | Ht 71.0 in | Wt 224.4 lb

## 2021-07-08 DIAGNOSIS — L918 Other hypertrophic disorders of the skin: Secondary | ICD-10-CM | POA: Diagnosis not present

## 2021-07-08 NOTE — Progress Notes (Signed)
Skin Tag Removal Procedure Note    Diagnosis: inflamed and painful skin tags  Location: neck and bilateral neck Informed Consent: Discussed risks (permanent scarring, infection, pain, bleeding, bruising, redness, and recurrence of the lesion) and benefits of the procedure, as well as the alternatives. He is aware that skin tags are benign lesions, and their removal is often not considered medically necessary. Informed consent was obtained. Preparation: The area was prepared in a standard fashion. Anesthesia: Lidocaine 2% with epinephrine Procedure Details: Iris scissors were used to perform sharp removal. Aluminum chloride was applied for hemostasis. Ointment and bandage were applied where needed. The patient tolerated the procedure well. Total number of lesions treated: 14 Plan: The patient was instructed on post-op care. Recommend OTC analgesia as needed for pain.  Claretta Fraise, MD

## 2021-08-03 ENCOUNTER — Telehealth: Payer: Self-pay | Admitting: *Deleted

## 2021-08-03 NOTE — Telephone Encounter (Signed)
(  Key: [id number])  Form Librarian, academic Form (CB)  SENT TO PLAN

## 2021-08-11 ENCOUNTER — Telehealth: Payer: Self-pay | Admitting: Family Medicine

## 2021-08-11 ENCOUNTER — Other Ambulatory Visit: Payer: Self-pay | Admitting: Family Medicine

## 2021-08-11 MED ORDER — TOLTERODINE TARTRATE ER 4 MG PO CP24: 4.0000 mg | ORAL_CAPSULE | Freq: Every day | ORAL | 3 refills | Status: DC

## 2021-08-11 NOTE — Telephone Encounter (Signed)
Pt aware new rx sent in. 

## 2021-08-11 NOTE — Telephone Encounter (Signed)
Pt calling because his insurance will not cover Vibegron (GEMTESA) 75 MG TABS and he wants to know if something else can be called in. Please call back and advise.

## 2021-08-11 NOTE — Telephone Encounter (Signed)
Please let the patient know that I sent [facility] to [facility]

## 2021-10-30 ENCOUNTER — Encounter: Payer: Self-pay | Admitting: Family Medicine

## 2021-10-30 ENCOUNTER — Ambulatory Visit (INDEPENDENT_AMBULATORY_CARE_PROVIDER_SITE_OTHER): Payer: BC Managed Care – PPO | Admitting: Family Medicine

## 2021-10-30 DIAGNOSIS — B9689 Other specified bacterial agents as the cause of diseases classified elsewhere: Secondary | ICD-10-CM

## 2021-10-30 DIAGNOSIS — J988 Other specified respiratory disorders: Secondary | ICD-10-CM

## 2021-10-30 MED ORDER — AZITHROMYCIN 250 MG PO TABS: ORAL_TABLET | ORAL | 0 refills | Status: DC

## 2021-10-30 NOTE — Progress Notes (Signed)
? ?Virtual Visit via Telephone Note ? ?I connected with Stephen Schwartz on 10/30/21 at 2:02 PM by telephone and verified that I am speaking with the correct person using two identifiers. Stephen Schwartz is currently located in his vehicle and nobody is currently with him during this visit. The provider, Loman Brooklyn, FNP is located in their office at time of visit. ? ?I discussed the limitations, risks, security and privacy concerns of performing an evaluation and management service by telephone and the availability of in person appointments. I also discussed with the patient that there may be a patient responsible charge related to this service. The patient expressed understanding and agreed to proceed. ? ?Subjective: ?PCP: Claretta Fraise, MD ? ?Chief Complaint  ?Patient presents with  ? Cough  ? ?Patient complains of cough, head congestion, sneezing, and sore throat. Onset of symptoms was  2.5  weeks ago, initially improving and then worsening. He is drinking plenty of fluids. Evaluation to date: previously diagnosed with COVID 2.5 weeks ago. Treatment to date: antihistamines, cough suppressants, and Nyquil . He does not smoke.  ? ? ?ROS: Per HPI ? ?Current Outpatient Medications:  ?  acetaminophen (TYLENOL) 500 MG tablet, Take 500 mg by mouth as needed for moderate pain., Disp: , Rfl:  ?  amLODipine (NORVASC) 10 MG tablet, Take 1 tablet (10 mg total) by mouth daily. For blood pressure, Disp: 90 tablet, Rfl: 3 ?  fluticasone (FLONASE) 50 MCG/ACT nasal spray, Place 2 sprays into both nostrils daily., Disp: 16 g, Rfl: 6 ?  lisinopril (ZESTRIL) 20 MG tablet, Take 1 tablet (20 mg total) by mouth daily., Disp: 90 tablet, Rfl: 2 ?  Pseudoeph-Doxylamine-DM-APAP (DAYQUIL/NYQUIL COLD/FLU RELIEF PO), Take by mouth., Disp: , Rfl:  ?  tolterodine (DETROL LA) 4 MG 24 hr capsule, Take 1 capsule (4 mg total) by mouth daily., Disp: 90 capsule, Rfl: 3 ?  Vibegron (GEMTESA) 75 MG TABS, Take 1 capsule by mouth daily., Disp: 90  tablet, Rfl: 3 ?  Vitamin D, Ergocalciferol, (DRISDOL) 1.25 MG (50000 UNIT) CAPS capsule, Take 1 capsule (50,000 Units total) by mouth every 7 (seven) days., Disp: 13 capsule, Rfl: 3 ? ?Allergies  ?Allergen Reactions  ? Myrbetriq [Mirabegron] Other (See Comments)  ?  Shoulder pain ?  ? ?Past Medical History:  ?Diagnosis Date  ? Hip pain   ? History of nephrolithiasis   ? Hypertension   ? Psoriasis   ? Psoriasis   ? ? ?Observations/Objective: ?A&O  ?No respiratory distress or wheezing audible over the phone ?Mood, judgement, and thought processes all WNL ? ?Assessment and Plan: ?1. Bacterial respiratory infection ?Continue symptom management.  ?- azithromycin (ZITHROMAX Z-PAK) 250 MG tablet; Take 2 tablets (500 mg) PO today, then 1 tablet (250 mg) PO daily x4 days.  Dispense: 6 tablet; Refill: 0 ? ? ?Follow Up Instructions: ? ?I discussed the assessment and treatment plan with the patient. The patient was provided an opportunity to ask questions and all were answered. The patient agreed with the plan and demonstrated an understanding of the instructions. ?  ?The patient was advised to call back or seek an in-person evaluation if the symptoms worsen or if the condition fails to improve as anticipated. ? ?The above assessment and management plan was discussed with the patient. The patient verbalized understanding of and has agreed to the management plan. Patient is aware to call the clinic if symptoms persist or worsen. Patient is aware when to return to the clinic for a  follow-up visit. Patient educated on when it is appropriate to go to the emergency department.  ? ?Time call ended: 2:13 PM ? ?I provided 11 minutes of non-face-to-face time during this encounter. ? ?Hendricks Limes, MSN, APRN, FNP-C ?[place] ?10/30/21 ?

## 2022-01-06 ENCOUNTER — Encounter: Payer: Self-pay | Admitting: Family Medicine

## 2022-01-06 ENCOUNTER — Ambulatory Visit (INDEPENDENT_AMBULATORY_CARE_PROVIDER_SITE_OTHER): Payer: BC Managed Care – PPO | Admitting: Family Medicine

## 2022-01-06 ENCOUNTER — Ambulatory Visit (INDEPENDENT_AMBULATORY_CARE_PROVIDER_SITE_OTHER): Payer: BC Managed Care – PPO

## 2022-01-06 VITALS — BP 139/89 | HR 57 | Temp 97.4°F | Ht 71.0 in | Wt 220.6 lb

## 2022-01-06 DIAGNOSIS — M79642 Pain in left hand: Secondary | ICD-10-CM

## 2022-01-06 DIAGNOSIS — R350 Frequency of micturition: Secondary | ICD-10-CM

## 2022-01-06 DIAGNOSIS — S52122A Displaced fracture of head of left radius, initial encounter for closed fracture: Secondary | ICD-10-CM

## 2022-01-06 DIAGNOSIS — E559 Vitamin D deficiency, unspecified: Secondary | ICD-10-CM

## 2022-01-06 DIAGNOSIS — Z125 Encounter for screening for malignant neoplasm of prostate: Secondary | ICD-10-CM

## 2022-01-06 DIAGNOSIS — Z0001 Encounter for general adult medical examination with abnormal findings: Secondary | ICD-10-CM | POA: Diagnosis not present

## 2022-01-06 DIAGNOSIS — E785 Hyperlipidemia, unspecified: Secondary | ICD-10-CM

## 2022-01-06 DIAGNOSIS — I10 Essential (primary) hypertension: Secondary | ICD-10-CM

## 2022-01-06 DIAGNOSIS — Z0000 Encounter for general adult medical examination without abnormal findings: Secondary | ICD-10-CM

## 2022-01-06 LAB — URINALYSIS
Bilirubin, UA: NEGATIVE
Glucose, UA: NEGATIVE
Ketones, UA: NEGATIVE
Leukocytes,UA: NEGATIVE
Nitrite, UA: NEGATIVE
Protein,UA: NEGATIVE
Specific Gravity, UA: 1.02 (ref 1.005–1.030)
Urobilinogen, Ur: 0.2 mg/dL (ref 0.2–1.0)
pH, UA: 7 (ref 5.0–7.5)

## 2022-01-06 MED ORDER — GEMTESA 75 MG PO TABS: 1.0000 | ORAL_TABLET | Freq: Every day | ORAL | 3 refills | Status: DC

## 2022-01-06 NOTE — Progress Notes (Signed)
Subjective:  Patient ID: Stephen Schwartz, male    DOB: 1970/11/24  Age: 51 y.o. MRN: [id number]  CC: Annual Exam   HPI Stephen Schwartz presents for Annual exam.   Hurt left wrist when Stephen Schwartz fell last week. Caught himself with the outstretched left hand.  Now having pain. Restricted motion.  Detrol wears off. Gemtesa lasts all day. Gets frequency when the detrol wears off. Having to urinate 3 times in 6 hours. 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    presents for  follow-up of hypertension. Patient has no history of headache chest pain or shortness of breath or recent cough. Patient also denies symptoms of TIA such as focal numbness or weakness. Patient denies side effects from medication. States taking it regularly. Just started the lisinpril 20 mg a few days ago. BP not down yet from that.        01/06/2022    9:14 AM 01/06/2022    9:10 AM 07/08/2021    8:13 AM  Depression screen PHQ 2/9  Decreased Interest 0 0 0  Down, Depressed, Hopeless 0 0 0  PHQ - 2 Score 0 0 0    History Stephen Schwartz has a past medical history of Hip pain, History of nephrolithiasis, Hypertension, Psoriasis, and Psoriasis.   Stephen Schwartz has a past surgical history that includes No past surgeries; Colonoscopy with propofol (N/A, 02/09/2021); and polypectomy (02/09/2021).   His family history includes Colon cancer in his paternal grandfather.Stephen Schwartz reports that Stephen Schwartz has never smoked. Stephen Schwartz has never used smokeless tobacco. Stephen Schwartz reports that Stephen Schwartz does not drink alcohol and does not use drugs.    ROS Review of Systems  Constitutional:  Negative for activity change, fatigue and unexpected weight change.  HENT:  Negative for congestion, ear pain, hearing loss, postnasal drip and trouble swallowing.   Eyes:  Negative for pain and visual disturbance.  Respiratory:  Negative for cough, chest tightness and shortness of breath.   Cardiovascular:  Negative for chest pain, palpitations and leg swelling.   Gastrointestinal:  Negative for abdominal distention, abdominal pain, blood in stool, constipation, diarrhea, nausea and vomiting.  Endocrine: Negative for cold intolerance, heat intolerance and polydipsia.  Genitourinary:  Positive for frequency. Negative for difficulty urinating, dysuria, flank pain and urgency.  Musculoskeletal:  Negative for arthralgias and joint swelling.  Skin:  Negative for color change, rash and wound.  Neurological:  Negative for dizziness, syncope, speech difficulty, weakness, light-headedness, numbness and headaches.  Hematological:  Does not bruise/bleed easily.  Psychiatric/Behavioral:  Negative for confusion, decreased concentration, dysphoric mood and sleep disturbance. The patient is not nervous/anxious.    Objective:  BP 139/89   Pulse (!) 57   Temp (!) 97.4 F (36.3 C)   Ht '5\' 11"'  (1.803 m)   Wt 220 lb 9.6 oz (100.1 kg)   SpO2 98%   BMI 30.77 kg/m   BP Readings from Last 3 Encounters:  01/06/22 139/89  07/08/21 140/88  07/06/21 (!) 146/87    Wt Readings from Last 3 Encounters:  01/06/22 220 lb 9.6 oz (100.1 kg)  07/08/21 224 lb 6.4 oz (101.8 kg)  07/06/21 222 lb 6.4 oz (100.9 kg)     Physical Exam Constitutional:      Appearance: Stephen Schwartz is well-developed.  HENT:     Head: Normocephalic and atraumatic.  Eyes:     Pupils: Pupils are equal, round, and reactive to light.  Neck:     Thyroid: No thyromegaly.  Trachea: No tracheal deviation.  Cardiovascular:     Rate and Rhythm: Normal rate and regular rhythm.     Heart sounds: Normal heart sounds. No murmur heard.   No friction rub. No gallop.  Pulmonary:     Breath sounds: Normal breath sounds. No wheezing or rales.  Abdominal:     General: Bowel sounds are normal. There is no distension.     Palpations: Abdomen is soft. There is no mass.     Tenderness: There is no abdominal tenderness.     Hernia: There is no hernia in the left inguinal area.  Genitourinary:    Penis: Normal.       Testes: Normal.  Musculoskeletal:        General: Tenderness (left hand and wrist for ROM felxion, extension and rotation) present.     Cervical back: Normal range of motion.  Lymphadenopathy:     Cervical: No cervical adenopathy.  Skin:    General: Skin is warm and dry.  Neurological:     Mental Status: Stephen Schwartz is alert and oriented to person, place, and time.      Assessment & Plan:   Stephen Schwartz was seen today for annual exam.  Diagnoses and all orders for this visit:  Well adult exam  Essential hypertension -     CBC with Differential/Platelet -     CMP14+EGFR  Hyperlipidemia, unspecified hyperlipidemia type -     Lipid panel  Urinary frequency -     Vibegron (GEMTESA) 75 MG TABS; Take 1 capsule by mouth daily. For urinary frequency -     Urinalysis  Screening for prostate cancer -     PSA, total and free  Vitamin D deficiency -     VITAMIN D 25 Hydroxy (Vit-D Deficiency, Fractures)  Hand pain, left -     DG Hand Complete Left; Future -     DG Wrist Complete Left; Future  Closed displaced fracture of head of left radius, initial encounter -     Ambulatory referral to Orthopedics    Wrist brace until Ortho evaluation. Placed at visit   I have discontinued Stephen Schwartz's azithromycin. I have also changed his Gemtesa. Additionally, I am having him maintain his fluticasone, acetaminophen, Vitamin D (Ergocalciferol), Pseudoeph-Doxylamine-DM-APAP (DAYQUIL/NYQUIL COLD/FLU RELIEF PO), lisinopril, amLODipine, and tolterodine.  Allergies as of 01/06/2022       Reactions   Myrbetriq [mirabegron] Other (See Comments)   Shoulder pain        Medication List        Accurate as of Jan 06, 2022 11:02 AM. If you have any questions, ask your nurse or doctor.          STOP taking these medications    azithromycin 250 MG tablet Commonly known as: Zithromax Z-Pak Stopped by: Claretta Fraise, MD       TAKE these medications    acetaminophen 500 MG  tablet Commonly known as: TYLENOL Take 500 mg by mouth as needed for moderate pain.   amLODipine 10 MG tablet Commonly known as: NORVASC Take 1 tablet (10 mg total) by mouth daily. For blood pressure   DAYQUIL/NYQUIL COLD/FLU RELIEF PO Take by mouth.   fluticasone 50 MCG/ACT nasal spray Commonly known as: FLONASE Place 2 sprays into both nostrils daily.   Gemtesa 75 MG Tabs Generic drug: Vibegron Take 1 capsule by mouth daily. For urinary frequency What changed: additional instructions Changed by: Claretta Fraise, MD   lisinopril 20 MG tablet Commonly known as: ZESTRIL  Take 1 tablet (20 mg total) by mouth daily.   tolterodine 4 MG 24 hr capsule Commonly known as: Detrol LA Take 1 capsule (4 mg total) by mouth daily.   Vitamin D (Ergocalciferol) 1.25 MG (50000 UNIT) Caps capsule Commonly known as: DRISDOL Take 1 capsule (50,000 Units total) by mouth every 7 (seven) days.         Follow-up: Return in about 6 months (around 07/09/2022).  Claretta Fraise, M.D.

## 2022-01-07 ENCOUNTER — Telehealth: Payer: Self-pay | Admitting: Family Medicine

## 2022-01-07 ENCOUNTER — Telehealth: Payer: Self-pay | Admitting: *Deleted

## 2022-01-07 LAB — CBC WITH DIFFERENTIAL/PLATELET
Basophils Absolute: 0 10*3/uL (ref 0.0–0.2)
Basos: 0 %
EOS (ABSOLUTE): 0.1 10*3/uL (ref 0.0–0.4)
Eos: 2 %
Hematocrit: 43.8 % (ref 37.5–51.0)
Hemoglobin: 14.6 g/dL (ref 13.0–17.7)
Immature Grans (Abs): 0 10*3/uL (ref 0.0–0.1)
Immature Granulocytes: 0 %
Lymphocytes Absolute: 1.8 10*3/uL (ref 0.7–3.1)
Lymphs: 39 %
MCH: 26.3 pg — ABNORMAL LOW (ref 26.6–33.0)
MCHC: 33.3 g/dL (ref 31.5–35.7)
MCV: 79 fL (ref 79–97)
Monocytes Absolute: 0.3 10*3/uL (ref 0.1–0.9)
Monocytes: 6 %
Neutrophils Absolute: 2.5 10*3/uL (ref 1.4–7.0)
Neutrophils: 53 %
Platelets: 267 10*3/uL (ref 150–450)
RBC: 5.55 x10E6/uL (ref 4.14–5.80)
RDW: 14.4 % (ref 11.6–15.4)
WBC: 4.7 10*3/uL (ref 3.4–10.8)

## 2022-01-07 LAB — PSA, TOTAL AND FREE
PSA, Free Pct: 27.3 %
PSA, Free: 0.9 ng/mL
Prostate Specific Ag, Serum: 3.3 ng/mL (ref 0.0–4.0)

## 2022-01-07 LAB — LIPID PANEL
Chol/HDL Ratio: 3.4 ratio (ref 0.0–5.0)
Cholesterol, Total: 122 mg/dL (ref 100–199)
HDL: 36 mg/dL — ABNORMAL LOW (ref >39)
LDL Chol Calc (NIH): 71 mg/dL (ref 0–99)
Triglycerides: 75 mg/dL (ref 0–149)
VLDL Cholesterol Cal: 15 mg/dL (ref 5–40)

## 2022-01-07 LAB — CMP14+EGFR
ALT: 16 IU/L (ref 0–44)
AST: 20 IU/L (ref 0–40)
Albumin/Globulin Ratio: 2 (ref 1.2–2.2)
Albumin: 4.9 g/dL (ref 4.0–5.0)
Alkaline Phosphatase: 80 IU/L (ref 44–121)
BUN/Creatinine Ratio: 9 (ref 9–20)
BUN: 11 mg/dL (ref 6–24)
Bilirubin Total: 1.7 mg/dL — ABNORMAL HIGH (ref 0.0–1.2)
CO2: 23 mmol/L (ref 20–29)
Calcium: 9.3 mg/dL (ref 8.7–10.2)
Chloride: 104 mmol/L (ref 96–106)
Creatinine, Ser: 1.27 mg/dL (ref 0.76–1.27)
Globulin, Total: 2.4 g/dL (ref 1.5–4.5)
Glucose: 98 mg/dL (ref 70–99)
Potassium: 4.6 mmol/L (ref 3.5–5.2)
Sodium: 142 mmol/L (ref 134–144)
Total Protein: 7.3 g/dL (ref 6.0–8.5)
eGFR: 69 mL/min/{1.73_m2} (ref >59)

## 2022-01-07 LAB — VITAMIN D 25 HYDROXY (VIT D DEFICIENCY, FRACTURES): Vit D, 25-Hydroxy: 30.8 ng/mL (ref 30.0–100.0)

## 2022-01-07 NOTE — Telephone Encounter (Signed)
Calling about Stephen Schwartz. Needs more clinical information. Please call back.

## 2022-01-07 NOTE — Progress Notes (Signed)
Hello Stephen Schwartz,  Your lab result is normal and/or stable.Some minor variations that are not significant are commonly marked abnormal, but do not represent any medical problem for you.  Best regards, Cornelious Bartolucci, M.D.

## 2022-01-07 NOTE — Telephone Encounter (Signed)
Sandi Mariscal Key: Carren Rang  PA Case ID: [id number]  Status: Sent to plan  Drug: Gemtesa '75MG'$  tablets

## 2022-01-08 ENCOUNTER — Encounter: Payer: Self-pay | Admitting: Orthopedic Surgery

## 2022-01-08 ENCOUNTER — Ambulatory Visit (INDEPENDENT_AMBULATORY_CARE_PROVIDER_SITE_OTHER): Payer: BC Managed Care – PPO

## 2022-01-08 ENCOUNTER — Ambulatory Visit (INDEPENDENT_AMBULATORY_CARE_PROVIDER_SITE_OTHER): Payer: BC Managed Care – PPO | Admitting: Orthopedic Surgery

## 2022-01-08 DIAGNOSIS — M25532 Pain in left wrist: Secondary | ICD-10-CM | POA: Diagnosis not present

## 2022-01-08 DIAGNOSIS — S52542A Smith's fracture of left radius, initial encounter for closed fracture: Secondary | ICD-10-CM | POA: Diagnosis not present

## 2022-01-08 DIAGNOSIS — S52502A Unspecified fracture of the lower end of left radius, initial encounter for closed fracture: Secondary | ICD-10-CM | POA: Insufficient documentation

## 2022-01-08 NOTE — H&P (View-Only) (Signed)
Office Visit Note   Patient: Stephen Schwartz           Date of Birth: 02-Jan-1971           MRN: [id number] Visit Date: 01/08/2022              Requested by: Claretta Fraise, MD [place],  [place] [address] PCP: Claretta Fraise, MD   Assessment & Plan: Visit Diagnoses:  1. Pain in left wrist   2. Closed Smith's fracture of left radius, initial encounter     Plan: We reviewed patient's x-rays today which showed a fracture of the left distal radius metaphysis with apex dorsal angulation.  Discussed with patient that I am concerned about the stability of this fracture given that the carpus is sitting volar to the axis of the radius.  We discussed conservative treatment with a cast versus operative treatment with a buttress plate and screw construct. We discussed risks of surgery including bleeding bleeding, infection, damage to neurovascular structures, nonunion, malunion, symptomatic hardware, and need for additional surgery.   After our discussion, we have elected to proceed with open reduction internal fixation.  A date and time will be confirmed with the patient.  Follow-Up Instructions: No follow-ups on file.   Orders:  Orders Placed This Encounter  Procedures   XR Wrist Complete Left   No orders of the defined types were placed in this encounter.     Procedures: No procedures performed   Clinical Data: No additional findings.   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  Patient was stepping off of his truck when he slipped and fell about 1 week ago.  He has been wearing a removable wrist brace since then.  He has pain in the distal aspect of the radius that is worse with prolonged activity or motion of the wrist.  He denies any pain elsewhere in the extremity.  Denies any numbness or paresthesias.   Review of  Systems   Objective: Vital Signs: There were no vitals taken for this visit.  Physical Exam Constitutional:      Appearance: Normal appearance.  Cardiovascular:     Rate and Rhythm: Normal rate.     Pulses: Normal pulses.  Pulmonary:     Effort: Pulmonary effort is normal.  Skin:    General: Skin is warm and dry.     Capillary Refill: Capillary refill takes less than 2 seconds.  Neurological:     Mental Status: He is alert.    Left Hand Exam   Tenderness  Left hand tenderness location: TTP at distal aspect of radius.   Other  Erythema: absent Sensation: normal Pulse: present  Comments:  Moderate wrist swelling.  Wrist ROM limited by pain.  Full AROM of fingers.  SILT throughout.      Specialty Comments:  No specialty comments available.  Imaging: No results found.   PMFS History: Patient Active Problem List   Diagnosis Date Noted   Closed fracture of left distal radius 01/08/2022   Primary osteoarthritis of left hip 01/25/2020   Essential hypertension 03/31/2018   Encounter for Department of Transportation (DOT) examination for driving license renewal 44/81/8563   Psoriasis 12/08/2012   Past Medical History:  Diagnosis Date   Hip pain    History of nephrolithiasis    Hypertension  Psoriasis    Psoriasis     Family History  Problem Relation Age of Onset   Colon cancer Paternal Grandfather     Past Surgical History:  Procedure Laterality Date   COLONOSCOPY WITH PROPOFOL N/A 02/09/2021   Procedure: COLONOSCOPY WITH PROPOFOL;  Surgeon: Eloise Harman, DO;  [facility];  Service: Endoscopy;  Laterality: N/A;  ASA II / 9:45   NO PAST SURGERIES     POLYPECTOMY  02/09/2021   Procedure: POLYPECTOMY;  Surgeon: Eloise Harman, DO;  Location: [facility];  Service: Endoscopy;;   Social History   Occupational History   Occupation: truck driver  Tobacco Use   Smoking status: Never   Smokeless tobacco: Never  Vaping Use   Vaping  Use: Never used  Substance and Sexual Activity   Alcohol use: No   Drug use: No   Sexual activity: Not on file

## 2022-01-08 NOTE — Progress Notes (Signed)
Office Visit Note   Patient: Stephen Schwartz           Date of Birth: 08-20-1970           MRN: [id number] Visit Date: 01/08/2022              Requested by: Claretta Fraise, MD [place],  [place] [address] PCP: Claretta Fraise, MD   Assessment & Plan: Visit Diagnoses:  1. Pain in left wrist   2. Closed Smith's fracture of left radius, initial encounter     Plan: We reviewed patient's x-rays today which showed a fracture of the left distal radius metaphysis with apex dorsal angulation.  Discussed with patient that I am concerned about the stability of this fracture given that the carpus is sitting volar to the axis of the radius.  We discussed conservative treatment with a cast versus operative treatment with a buttress plate and screw construct. We discussed risks of surgery including bleeding bleeding, infection, damage to neurovascular structures, nonunion, malunion, symptomatic hardware, and need for additional surgery.   After our discussion, we have elected to proceed with open reduction internal fixation.  A date and time will be confirmed with the patient.  Follow-Up Instructions: No follow-ups on file.   Orders:  Orders Placed This Encounter  Procedures   XR Wrist Complete Left   No orders of the defined types were placed in this encounter.     Procedures: No procedures performed   Clinical Data: No additional findings.   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  Patient was stepping off of his truck when he slipped and fell about 1 week ago.  He has been wearing a removable wrist brace since then.  He has pain in the distal aspect of the radius that is worse with prolonged activity or motion of the wrist.  He denies any pain elsewhere in the extremity.  Denies any numbness or paresthesias.   Review of  Systems   Objective: Vital Signs: There were no vitals taken for this visit.  Physical Exam Constitutional:      Appearance: Normal appearance.  Cardiovascular:     Rate and Rhythm: Normal rate.     Pulses: Normal pulses.  Pulmonary:     Effort: Pulmonary effort is normal.  Skin:    General: Skin is warm and dry.     Capillary Refill: Capillary refill takes less than 2 seconds.  Neurological:     Mental Status: He is alert.    Left Hand Exam   Tenderness  Left hand tenderness location: TTP at distal aspect of radius.   Other  Erythema: absent Sensation: normal Pulse: present  Comments:  Moderate wrist swelling.  Wrist ROM limited by pain.  Full AROM of fingers.  SILT throughout.      Specialty Comments:  No specialty comments available.  Imaging: No results found.   PMFS History: Patient Active Problem List   Diagnosis Date Noted   Closed fracture of left distal radius 01/08/2022   Primary osteoarthritis of left hip 01/25/2020   Essential hypertension 03/31/2018   Encounter for Department of Transportation (DOT) examination for driving license renewal 60/73/7106   Psoriasis 12/08/2012   Past Medical History:  Diagnosis Date   Hip pain    History of nephrolithiasis    Hypertension  Psoriasis    Psoriasis     Family History  Problem Relation Age of Onset   Colon cancer Paternal Grandfather     Past Surgical History:  Procedure Laterality Date   COLONOSCOPY WITH PROPOFOL N/A 02/09/2021   Procedure: COLONOSCOPY WITH PROPOFOL;  Surgeon: Eloise Harman, DO;  [facility];  Service: Endoscopy;  Laterality: N/A;  ASA II / 9:45   NO PAST SURGERIES     POLYPECTOMY  02/09/2021   Procedure: POLYPECTOMY;  Surgeon: Eloise Harman, DO;  [facility];  Service: Endoscopy;;   Social History   Occupational History   Occupation: truck driver  Tobacco Use   Smoking status: Never   Smokeless tobacco: Never  Vaping Use   Vaping  Use: Never used  Substance and Sexual Activity   Alcohol use: No   Drug use: No   Sexual activity: Not on file

## 2022-01-08 NOTE — Telephone Encounter (Signed)
[facility] called stating that they need the additional info that they requested on PA for [facility], sent to them before tomorrow.

## 2022-01-12 ENCOUNTER — Other Ambulatory Visit: Payer: Self-pay | Admitting: Family Medicine

## 2022-01-12 ENCOUNTER — Encounter (HOSPITAL_COMMUNITY): Payer: Self-pay | Admitting: Orthopedic Surgery

## 2022-01-12 ENCOUNTER — Other Ambulatory Visit: Payer: Self-pay

## 2022-01-12 MED ORDER — TROSPIUM CHLORIDE ER 60 MG PO CP24: 60.0000 mg | ORAL_CAPSULE | Freq: Every day | ORAL | 3 refills | Status: DC

## 2022-01-12 NOTE — Telephone Encounter (Signed)
List of medications that patient needs to take sent to Dr. Livia Snellen

## 2022-01-12 NOTE — Telephone Encounter (Signed)
Patient notified

## 2022-01-12 NOTE — Telephone Encounter (Signed)
If he wants to continue this medicine he will need to see urology. I can prescribe a substitute if he prefers.

## 2022-01-12 NOTE — Telephone Encounter (Signed)
Pt called back in and states he would like the substitute medication sent in , and he also stated he was having surgery on his arm tomorrow morning , so he is unsure when he will be able to start it

## 2022-01-12 NOTE — Telephone Encounter (Signed)
Called and left vm for pt to call the office back

## 2022-01-12 NOTE — Telephone Encounter (Signed)
Trospium sent in for pt.

## 2022-01-12 NOTE — Telephone Encounter (Signed)
Sandi Mariscal (Key: [id number]) - [id number] Logan Bores '75MG'$  tablets Status: PA Response - Denied Created: May 25th, 2023 Sent: May 25th, 2023  Reason:  (1) You did not respond fully (inadequate response) or cannot tolerate at least one formulary alternative in the same pharmacological class, reasonably equivalent therapeutic class alternatives will be considered (when there are limited pharmacological class alternatives available) which may include: Darifenacin ER, solifenacin, trospium or trospium ER which do not require a prior authorization, fesoterodine ER or Toviaz, oxybutynin tablet or Oxytrol which require prior authorization. The information provided does not show that you meet the criteria listed above.

## 2022-01-12 NOTE — Progress Notes (Signed)
PCP - Claretta Fraise, MD Cardiologist -    ERAS [place] TEST- n/a  Anesthesia review: n/a  -------------  SDW INSTRUCTIONS:  Your procedure is scheduled on 5/31. Please report to [facility] Entrance "A" at 1315 PM, and check in at the Admitting office. Call this number if you have problems the morning of surgery: [phone]   Remember: Do not eat after midnight the night before your surgery  You may drink clear liquids until 12:45  the day of your surgery.   Clear liquids allowed are: Water, Non-Citrus Juices (without pulp), Carbonated Beverages, Clear Tea, Black Coffee Only, and Gatorade    Medications to take morning of surgery with a sip of water include: Tylenol - if needed   As of today, STOP taking any Aspirin (unless otherwise instructed by your surgeon), Aleve, Naproxen, Ibuprofen, Motrin, Advil, Goody's, BC's, all herbal medications, fish oil, and all vitamins.    The Morning of Surgery Do not wear jewelry Do not wear lotions, powders, colognes, or deodorant Do not bring valuables to the hospital. [facility] is not responsible for any belongings or valuables.  If you are a smoker, DO NOT Smoke 24 hours prior to surgery  If you wear a CPAP at night please bring your mask the morning of surgery   Remember that you must have someone to transport you home after your surgery, and remain with you for 24 hours if you are discharged the same day.  Please bring cases for contacts, glasses, hearing aids, dentures or bridgework because it cannot be worn into surgery.   Patients discharged the day of surgery will not be allowed to drive home.   Please shower the NIGHT BEFORE/MORNING OF SURGERY (use antibacterial soap like DIAL soap if possible). Wear comfortable clothes the morning of surgery. Oral Hygiene is also important to reduce your risk of infection.  Remember - BRUSH YOUR TEETH THE MORNING OF SURGERY WITH YOUR REGULAR TOOTHPASTE  Patient denies  shortness of breath, fever, cough and chest pain.

## 2022-01-13 ENCOUNTER — Ambulatory Visit (HOSPITAL_COMMUNITY)
Admission: RE | Admit: 2022-01-13 | Discharge: 2022-01-13 | Disposition: A | Discharge status: Home / Self Care | Payer: BC Managed Care – PPO | Attending: Orthopedic Surgery | Admitting: Orthopedic Surgery

## 2022-01-13 ENCOUNTER — Ambulatory Visit (HOSPITAL_COMMUNITY): Payer: BC Managed Care – PPO

## 2022-01-13 ENCOUNTER — Ambulatory Visit (HOSPITAL_COMMUNITY): Payer: BC Managed Care – PPO | Admitting: Anesthesiology

## 2022-01-13 ENCOUNTER — Encounter (HOSPITAL_COMMUNITY)
Admission: RE | Disposition: A | Discharge status: Home / Self Care | Payer: Self-pay | Source: Home / Self Care | Attending: Orthopedic Surgery

## 2022-01-13 ENCOUNTER — Other Ambulatory Visit: Payer: Self-pay

## 2022-01-13 ENCOUNTER — Encounter (HOSPITAL_COMMUNITY): Payer: Self-pay | Admitting: Orthopedic Surgery

## 2022-01-13 DIAGNOSIS — S52542A Smith's fracture of left radius, initial encounter for closed fracture: Secondary | ICD-10-CM

## 2022-01-13 DIAGNOSIS — X58XXXA Exposure to other specified factors, initial encounter: Secondary | ICD-10-CM | POA: Diagnosis not present

## 2022-01-13 DIAGNOSIS — S52502A Unspecified fracture of the lower end of left radius, initial encounter for closed fracture: Secondary | ICD-10-CM | POA: Diagnosis not present

## 2022-01-13 DIAGNOSIS — M1990 Unspecified osteoarthritis, unspecified site: Secondary | ICD-10-CM | POA: Diagnosis not present

## 2022-01-13 DIAGNOSIS — Z79899 Other long term (current) drug therapy: Secondary | ICD-10-CM | POA: Diagnosis not present

## 2022-01-13 DIAGNOSIS — I10 Essential (primary) hypertension: Secondary | ICD-10-CM | POA: Diagnosis not present

## 2022-01-13 HISTORY — PX: OPEN REDUCTION INTERNAL FIXATION (ORIF) DISTAL RADIAL FRACTURE: SHX5989

## 2022-01-13 SURGERY — OPEN REDUCTION INTERNAL FIXATION (ORIF) DISTAL RADIUS FRACTURE
Anesthesia: Monitor Anesthesia Care | Laterality: Left

## 2022-01-13 MED ORDER — FENTANYL CITRATE (PF) 250 MCG/5ML IJ SOLN
INTRAMUSCULAR | Status: AC
  Filled 2022-01-13: qty 5

## 2022-01-13 MED ORDER — CEFAZOLIN SODIUM-DEXTROSE 2-4 GM/100ML-% IV SOLN
2.0000 g | INTRAVENOUS | Status: AC
  Administered 2022-01-13: 2 g via INTRAVENOUS
  Filled 2022-01-13: qty 100

## 2022-01-13 MED ORDER — PROPOFOL 10 MG/ML IV BOLUS
INTRAVENOUS | Status: AC
  Filled 2022-01-13: qty 20

## 2022-01-13 MED ORDER — FENTANYL CITRATE (PF) 100 MCG/2ML IJ SOLN
INTRAMUSCULAR | Status: AC
  Administered 2022-01-13: 50 ug via INTRAVENOUS
  Filled 2022-01-13: qty 2

## 2022-01-13 MED ORDER — CHLORHEXIDINE GLUCONATE 0.12 % MT SOLN: 15.0000 mL | Freq: Once | OROMUCOSAL | Status: AC

## 2022-01-13 MED ORDER — CLONIDINE HCL (ANALGESIA) 100 MCG/ML EP SOLN
EPIDURAL | Status: DC | PRN
  Administered 2022-01-13: 50 ug

## 2022-01-13 MED ORDER — 0.9 % SODIUM CHLORIDE (POUR BTL) OPTIME
TOPICAL | Status: DC | PRN
  Administered 2022-01-13: 1000 mL

## 2022-01-13 MED ORDER — PROPOFOL 10 MG/ML IV BOLUS
INTRAVENOUS | Status: DC | PRN
  Administered 2022-01-13: 30 mg via INTRAVENOUS

## 2022-01-13 MED ORDER — MIDAZOLAM HCL 2 MG/2ML IJ SOLN
2.0000 mg | Freq: Once | INTRAMUSCULAR | Status: AC
Start: 2022-01-13 — End: 2022-01-13

## 2022-01-13 MED ORDER — PHENYLEPHRINE 80 MCG/ML (10ML) SYRINGE FOR IV PUSH (FOR BLOOD PRESSURE SUPPORT)
PREFILLED_SYRINGE | INTRAVENOUS | Status: AC
  Filled 2022-01-13: qty 10

## 2022-01-13 MED ORDER — PHENYLEPHRINE HCL-NACL 20-0.9 MG/250ML-% IV SOLN
INTRAVENOUS | Status: DC | PRN
  Administered 2022-01-13: 40 ug/min via INTRAVENOUS

## 2022-01-13 MED ORDER — CHLORHEXIDINE GLUCONATE 0.12 % MT SOLN
OROMUCOSAL | Status: AC
  Administered 2022-01-13: 15 mL via OROMUCOSAL
  Filled 2022-01-13: qty 15

## 2022-01-13 MED ORDER — MIDAZOLAM HCL 2 MG/2ML IJ SOLN
INTRAMUSCULAR | Status: AC
  Filled 2022-01-13: qty 2

## 2022-01-13 MED ORDER — PHENYLEPHRINE 80 MCG/ML (10ML) SYRINGE FOR IV PUSH (FOR BLOOD PRESSURE SUPPORT)
PREFILLED_SYRINGE | INTRAVENOUS | Status: DC | PRN
  Administered 2022-01-13 (×3): 160 ug via INTRAVENOUS

## 2022-01-13 MED ORDER — ONDANSETRON HCL 4 MG/2ML IJ SOLN
INTRAMUSCULAR | Status: AC
  Filled 2022-01-13: qty 2

## 2022-01-13 MED ORDER — ACETAMINOPHEN 500 MG PO TABS
ORAL_TABLET | ORAL | Status: AC
  Administered 2022-01-13: 1000 mg via ORAL
  Filled 2022-01-13: qty 2

## 2022-01-13 MED ORDER — PROPOFOL 500 MG/50ML IV EMUL
INTRAVENOUS | Status: DC | PRN
  Administered 2022-01-13: 75 ug/kg/min via INTRAVENOUS
  Administered 2022-01-13: 55 ug/kg/min via INTRAVENOUS

## 2022-01-13 MED ORDER — ONDANSETRON HCL 4 MG/2ML IJ SOLN
INTRAMUSCULAR | Status: DC | PRN
  Administered 2022-01-13: 4 mg via INTRAVENOUS

## 2022-01-13 MED ORDER — OXYCODONE HCL 5 MG PO TABS: 5.0000 mg | ORAL_TABLET | Freq: Four times a day (QID) | ORAL | 0 refills | Status: AC | PRN

## 2022-01-13 MED ORDER — MIDAZOLAM HCL 2 MG/2ML IJ SOLN
INTRAMUSCULAR | Status: AC
  Administered 2022-01-13: 2 mg via INTRAVENOUS
  Filled 2022-01-13: qty 2

## 2022-01-13 MED ORDER — ORAL CARE MOUTH RINSE: 15.0000 mL | Freq: Once | OROMUCOSAL | Status: AC

## 2022-01-13 MED ORDER — FENTANYL CITRATE (PF) 100 MCG/2ML IJ SOLN: 50.0000 ug | Freq: Once | INTRAMUSCULAR | Status: AC

## 2022-01-13 MED ORDER — ACETAMINOPHEN 500 MG PO TABS: 1000.0000 mg | ORAL_TABLET | Freq: Once | ORAL | Status: AC

## 2022-01-13 MED ORDER — ROPIVACAINE HCL 5 MG/ML IJ SOLN
INTRAMUSCULAR | Status: DC | PRN
  Administered 2022-01-13: 30 mL via PERINEURAL

## 2022-01-13 MED ORDER — MIDAZOLAM HCL 2 MG/2ML IJ SOLN
INTRAMUSCULAR | Status: DC | PRN
  Administered 2022-01-13: 2 mg via INTRAVENOUS

## 2022-01-13 MED ORDER — LACTATED RINGERS IV SOLN: INTRAVENOUS | Status: DC

## 2022-01-13 SURGICAL SUPPLY — 73 items
BAG COUNTER SPONGE SURGICOUNT (BAG) ×2 IMPLANT
BIT DRILL SOLID 2.0X40MM (BIT) IMPLANT
BIT DRILL SOLID 2.5X40MM (BIT) IMPLANT
BLADE CLIPPER SURG (BLADE) IMPLANT
BNDG CMPR 9X4 STRL LF SNTH (GAUZE/BANDAGES/DRESSINGS) ×1
BNDG ELASTIC 3X5.8 VLCR STR LF (GAUZE/BANDAGES/DRESSINGS) ×2 IMPLANT
BNDG ELASTIC 4X5.8 VLCR STR LF (GAUZE/BANDAGES/DRESSINGS) ×2 IMPLANT
BNDG ESMARK 4X9 LF (GAUZE/BANDAGES/DRESSINGS) ×2 IMPLANT
BNDG GAUZE DERMACEA FLUFF (GAUZE/BANDAGES/DRESSINGS) ×1
BNDG GAUZE DERMACEA FLUFF 4 (GAUZE/BANDAGES/DRESSINGS) IMPLANT
BNDG GAUZE ELAST 4 BULKY (GAUZE/BANDAGES/DRESSINGS) ×1 IMPLANT
BNDG GZE DERMACEA 4 6PLY (GAUZE/BANDAGES/DRESSINGS) ×1
CANISTER SUCT 3000ML PPV (MISCELLANEOUS) ×2 IMPLANT
CORD BIPOLAR FORCEPS 12FT (ELECTRODE) ×2 IMPLANT
COVER SURGICAL LIGHT HANDLE (MISCELLANEOUS) ×2 IMPLANT
CUFF TOURN SGL QUICK 18X4 (TOURNIQUET CUFF) ×2 IMPLANT
CUFF TOURN SGL QUICK 24 (TOURNIQUET CUFF)
CUFF TRNQT CYL 24X4X16.5-23 (TOURNIQUET CUFF) IMPLANT
DECANTER SPIKE VIAL GLASS SM (MISCELLANEOUS) ×2 IMPLANT
DRAPE OEC MINIVIEW 54X84 (DRAPES) ×2 IMPLANT
DRAPE SURG 17X11 SM STRL (DRAPES) ×2 IMPLANT
DRILL SOLID 2.0X40MM (BIT) ×2
DRILL SOLID 2.5X40MM (BIT) ×2
DRIVER POLYAXIAL LOCK SCREW (INSTRUMENTS) ×1 IMPLANT
DRSG ADAPTIC 3X8 NADH LF (GAUZE/BANDAGES/DRESSINGS) ×1 IMPLANT
GAUZE 4X4 16PLY ~~LOC~~+RFID DBL (SPONGE) ×2 IMPLANT
GAUZE SPONGE 4X4 12PLY STRL (GAUZE/BANDAGES/DRESSINGS) ×1 IMPLANT
GAUZE XEROFORM 5X9 LF (GAUZE/BANDAGES/DRESSINGS) ×2 IMPLANT
GLOVE BIOGEL PI IND STRL 8.5 (GLOVE) ×1 IMPLANT
GLOVE BIOGEL PI INDICATOR 8.5 (GLOVE) ×1
GLOVE SURG ORTHO 8.0 STRL STRW (GLOVE) ×2 IMPLANT
GOWN STRL REUS W/ TWL LRG LVL3 (GOWN DISPOSABLE) ×1 IMPLANT
GOWN STRL REUS W/ TWL XL LVL3 (GOWN DISPOSABLE) ×1 IMPLANT
GOWN STRL REUS W/TWL LRG LVL3 (GOWN DISPOSABLE) ×2
GOWN STRL REUS W/TWL XL LVL3 (GOWN DISPOSABLE) ×2
GUIDE AIMING 1.5MM (WIRE) ×2 IMPLANT
KIT BASIN OR (CUSTOM PROCEDURE TRAY) ×2 IMPLANT
KIT TURNOVER KIT B (KITS) ×2 IMPLANT
NDL HYPO 25X1 1.5 SAFETY (NEEDLE) ×1 IMPLANT
NEEDLE HYPO 25X1 1.5 SAFETY (NEEDLE) ×2 IMPLANT
NS IRRIG 1000ML POUR BTL (IV SOLUTION) ×2 IMPLANT
PACK ORTHO EXTREMITY (CUSTOM PROCEDURE TRAY) ×2 IMPLANT
PAD ARMBOARD 7.5X6 YLW CONV (MISCELLANEOUS) ×4 IMPLANT
PAD CAST 3X4 CTTN HI CHSV (CAST SUPPLIES) IMPLANT
PAD CAST 4YDX4 CTTN HI CHSV (CAST SUPPLIES) ×1 IMPLANT
PADDING CAST COTTON 3X4 STRL (CAST SUPPLIES) ×2
PADDING CAST COTTON 4X4 STRL (CAST SUPPLIES) ×2
PEG GEMINUS SMOOTH LOCK 2.0X19 (Peg) ×2 IMPLANT
PEG GEMINUS THRD TPNL 2.7X22 (Peg) ×2 IMPLANT
PEG NON LOCK THREAD 2.7X24MM (Screw) ×1 IMPLANT
PEG SMOOTH LOCK 2.0X18 (Screw) ×4 IMPLANT
PEG SMOOTH LOCKING 20MM (Peg) ×1 IMPLANT
PLATE STD 3H LEFT (Plate) ×1 IMPLANT
SCREW GEMINUS PALS 2.5X18 (Screw) ×1 IMPLANT
SCREW GEMINUS PANL 3.5X13 (Screw) ×1 IMPLANT
SCREW POLY NON LOCK 3.5MMX12MM (Screw) ×2 IMPLANT
SCREWDRIVER SURG ST 2 (INSTRUMENTS) ×2 IMPLANT
SLING ARM FOAM STRAP LRG (SOFTGOODS) ×1 IMPLANT
SOAP 2 % CHG 4 OZ (WOUND CARE) ×2 IMPLANT
SPLINT FIBERGLASS 4X30 (CAST SUPPLIES) ×1 IMPLANT
SPONGE T-LAP 4X18 ~~LOC~~+RFID (SPONGE) ×2 IMPLANT
SUT ETHILON 4 0 P 3 18 (SUTURE) ×1 IMPLANT
SUT MNCRL AB 3-0 PS2 27 (SUTURE) ×1 IMPLANT
SUT PROLENE 4 0 PS 2 18 (SUTURE) IMPLANT
SUT VIC AB 2-0 CT1 27 (SUTURE)
SUT VIC AB 2-0 CT1 TAPERPNT 27 (SUTURE) IMPLANT
SUT VICRYL 4-0 PS2 18IN ABS (SUTURE) IMPLANT
SYR CONTROL 10ML LL (SYRINGE) IMPLANT
TOWEL GREEN STERILE (TOWEL DISPOSABLE) ×2 IMPLANT
TOWEL GREEN STERILE FF (TOWEL DISPOSABLE) IMPLANT
TUBE CONNECTING 12X1/4 (SUCTIONS) ×2 IMPLANT
WATER STERILE IRR 1000ML POUR (IV SOLUTION) ×1 IMPLANT
YANKAUER SUCT BULB TIP NO VENT (SUCTIONS) ×1 IMPLANT

## 2022-01-13 NOTE — Anesthesia Procedure Notes (Signed)
Procedure Name: MAC Date/Time: 01/13/2022 4:03 PM Performed by: Reece Agar, CRNA Pre-anesthesia Checklist: Patient identified, Emergency Drugs available, Suction available, Patient being monitored and Timeout performed Patient Re-evaluated:Patient Re-evaluated prior to induction Oxygen Delivery Method: Simple face mask

## 2022-01-13 NOTE — Anesthesia Preprocedure Evaluation (Addendum)
Anesthesia Evaluation  Patient identified by MRN, date of birth, ID band Patient awake    Reviewed: Allergy & Precautions, NPO status , Patient's Chart, lab work & pertinent test results  Airway Mallampati: II  TM Distance: >3 FB Neck ROM: Full    Dental  (+) Dental Advisory Given   Pulmonary neg pulmonary ROS,    breath sounds clear to auscultation       Cardiovascular hypertension, Pt. on medications  Rhythm:Regular Rate:Normal     Neuro/Psych negative neurological ROS  negative psych ROS   GI/Hepatic negative GI ROS, Neg liver ROS,   Endo/Other  negative endocrine ROS  Renal/GU negative Renal ROS  negative genitourinary   Musculoskeletal  (+) Arthritis , Osteoarthritis,    Abdominal   Peds  Hematology negative hematology ROS (+) Hb 14.6, plt 267   Anesthesia Other Findings L distal radius fx   Reproductive/Obstetrics negative OB ROS                            Anesthesia Physical Anesthesia Plan  ASA: 2  Anesthesia Plan: Regional and MAC   Post-op Pain Management: Regional block* and Tylenol PO (pre-op)*   Induction:   PONV Risk Score and Plan: 1 and Propofol infusion and Treatment may vary due to age or medical condition  Airway Management Planned: Natural Airway and Simple Face Mask  Additional Equipment:   Intra-op Plan:   Post-operative Plan:   Informed Consent: I have reviewed the patients History and Physical, chart, labs and discussed the procedure including the risks, benefits and alternatives for the proposed anesthesia with the patient or authorized representative who has indicated his/her understanding and acceptance.       Plan Discussed with: CRNA  Anesthesia Plan Comments:         Anesthesia Quick Evaluation

## 2022-01-13 NOTE — Anesthesia Postprocedure Evaluation (Signed)
Anesthesia Post Note  Patient: Stephen Schwartz  Procedure(s) Performed: OPEN REDUCTION INTERNAL FIXATION (ORIF) DISTAL RADIAL FRACTURE (Left)     Patient location during evaluation: PACU Anesthesia Type: Regional and MAC Level of consciousness: awake and alert Pain management: pain level controlled Vital Signs Assessment: post-procedure vital signs reviewed and stable Respiratory status: spontaneous breathing, nonlabored ventilation and respiratory function stable Cardiovascular status: stable and blood pressure returned to baseline Postop Assessment: no apparent nausea or vomiting Anesthetic complications: no   No notable events documented.  Last Vitals:  Vitals:   01/13/22 1905 01/13/22 1920  BP: (!) 136/92 (!) 135/92  Pulse: 84 77  Resp: 13 10  Temp:  36.7 C  SpO2: 94% 93%    Last Pain:  Vitals:   01/13/22 1850  TempSrc:   PainSc: Asleep                 Shaman Muscarella,W. EDMOND

## 2022-01-13 NOTE — Transfer of Care (Signed)
Immediate Anesthesia Transfer of Care Note  Patient: Stephen Schwartz  Procedure(s) Performed: OPEN REDUCTION INTERNAL FIXATION (ORIF) DISTAL RADIAL FRACTURE (Left)  Patient Location: PACU  Anesthesia Type:MAC and Regional  Level of Consciousness: awake and alert   Airway & Oxygen Therapy: Patient Spontanous Breathing and Patient connected to face mask oxygen  Post-op Assessment: Report given to RN and Post -op Vital signs reviewed and stable  Post vital signs: Reviewed and stable  Last Vitals:  Vitals Value Taken Time  BP 137/92 01/13/22 1850  Temp 36.3 C 01/13/22 1850  Pulse 79 01/13/22 1853  Resp 13 01/13/22 1853  SpO2 95 % 01/13/22 1853  Vitals shown include unvalidated device data.  Last Pain:  Vitals:   01/13/22 1850  TempSrc:   PainSc: Asleep      Patients Stated Pain Goal: 3 (94/58/59 2924)  Complications: No notable events documented.

## 2022-01-13 NOTE — Op Note (Signed)
Date of Surgery: 01/13/2022  INDICATIONS: Patient is a 51 y.o.-year-old male with a left distal radius fracture with apex dorsal angulation.  Given the fracture pattern, I was concerned about continued volar displacement of the carpus and recommended open reduction and internal fixation Risks, benefits, and alternatives to surgery were again discussed with the patient in the preoperative area. The patient wishes to proceed with surgery.  Informed consent was signed after our discussion.   PREOPERATIVE DIAGNOSIS:  Closed left distal radius fracture  POSTOPERATIVE DIAGNOSIS: Same.  PROCEDURE:  Open reduction and internal fixation of left distal radius fracture   SURGEON: Audria Nine, M.D.  ASSIST:   ANESTHESIA:  Regional, general  IV FLUIDS AND URINE: See anesthesia.  ESTIMATED BLOOD LOSS: 15 mL.  IMPLANTS:  Implant Name Type Inv. Item Serial No. Manufacturer Lot No. LRB No. Used Action  SCREW GEMINUS PANL 3.5X13 - [id number] Screw SCREW GEMINUS PANL 3.5X13  SKELETAL [employer]YNAM[employer]  Left 1 Implanted  PLATE STD 3H LEFT - [id number] Plate PLATE STD 3H LEFT  SKELETAL [employer]YNAM[employer]  Left 1 Implanted  Thread Peg NonLock 2.7 mm x 24 mm Peg   SKELETAL DYNAMICS  Left 1 Implanted  PEG GEMINUS THRD TPNL 2.7X22 - [id number] Peg PEG GEMINUS THRD TPNL 2.7X22  SKELETAL [employer]YNAM[employer]  Left 2 Implanted  PEG GEMINUS SMOOTH LOCK 2.0X19 - [id number] Peg PEG GEMINUS SMOOTH LOCK 2.0X19  SKELETAL [employer]YNAM[employer]  Left 2 Implanted  PEG SMOOTH LOCK 2.0X18 - [id number] Screw PEG SMOOTH LOCK 2.0X18  SKELETAL [employer]YNAM[employer]  Left 4 Implanted  SCREW GEMINUS PALS 2.5X18 - [id number] Screw SCREW GEMINUS PALS 2.5X18  SKELETAL [employer]YNAM[employer]  Left 1 Implanted  PEG SMOOTH LOCKING 20MM - [id number] Peg PEG SMOOTH LOCKING 20MM  SKELETAL DYNAMICS  Left 1 Implanted  SCREW POLY NON LOCK 3.1SEL95VU - [id number] Screw SCREW POLY NON LOCK 3.6HUO37GB  SKELETAL DYNAMICS  Left 2 Implanted     DRAINS: None  COMPLICATIONS: None  DESCRIPTION OF PROCEDURE: The  patient was met in the preoperative holding area where the surgical site was marked and the consent form was verified.  A regional block was performed by anesthesias. The patient was then taken to the operating room and transferred to the operating table.  All bony prominences were well padded.  A tourniquet was applied to the left upper arm.  General endotracheal anesthesia was induced.  The operative extremity was prepped and draped in the usual and sterile fashion.  A formal time-out was performed to confirm that this was the correct patient, surgery, side, and site.   Following timeout, the limb was gently exsanguinated and the tourniquet inflated to 250 mmHg.  A longitudinal STR approach was made to the radius.  The skin and subcutaneous tissue were divided.  Small crossing vessels were coagulated with bipolar cautery.  The FCR tendon sheath was incised.  The tendon was retracted radially and the floor of the FCR tendon sheath was incised.  Bo Mcclintock space was developed between the FPL and pronator quadratus.  An L-shaped incision at the distal and radial aspect of the pronator in the transitional fibrous zone was made.  The pronator quadratus was subperiosteally elevated using an elevator.  Blunt dissection was used to identify the brachial radialis tendon which was divided.  Care was taken to protect the underlying first dorsal compartment tendons.  The fracture site was identified.  Hematoma and early callus was debrided using a small curette and rondure.  The fracture was reduced with a combination of traction, supination of  the shaft fragment, and pressure on the distal fragment.  A standard left 3-hole Geminus plate was selected.  Plate was applied to the volar aspect of the distal radius and the oblong drill hole was filled with a bicortical screw.  Despite an anatomic reduction of the radius, the plate did not seem to fit well on the volar surface of the radius.  When the plate was applied distal enough  such that the contour match the radius, the screws appeared to be very close to being intra-articular.  With the plate situated below the watershed line, there was a space between the plate and the volar aspect of the distal radius.  Bicortical screws were used in the distal holes of the plate to try to bring the plate down but this did not have much effect.  The fracture appeared to be well reduced on both the AP and lateral views.  On the lateral view in particular, the carpus seem to be more in line with the axis of the radius.  My concern initially was that the carpus was volarly translated relative to the axis of the radius and that appearance was corrected.  The AP view showed appropriate restoration of radial height.  Smooth locking pegs were then placed in the distal holes of the plate.  Bicortical screws were then placed in the holes in the shaft.  A 30 degree lateral view show that all the screws were extra-articular and subchondral in position.  Final AP and lateral views showed appropriate reduction of the fracture.  The plate was sitting off the bone slightly despite multiple attempts at appropriate positioning.  The DRUJ was tested and appeared to be stable in neutral, supination, and pronation.  At this point, the wound was thoroughly irrigated with copious sterile saline.  The pronator quadratus was repaired using 3-0 Monocryl sutures in a figure-of-eight fashion.  The tourniquet was let down hemostasis was achieved with direct pressure over the wound.  He was warm, pink, and well-perfused.  The wound was closed in a layered fashion using 3-0 Monocryl sutures in a buried fashion followed by 4-0 nylon suture in horizontal mattress fashion.  The wound was dressed with Xeroform, folded Kerlix, cast padding, and a well-padded volar splint was applied.  The patient was then reversed of anesthesia and extubated uneventfully.  He is transferred to the postoperative bed.  All counts were correct times  within the procedure.  The patient was then taken the PACU in stable condition.  POSTOPERATIVE PLAN: I will see him back in the office in 10 to 14 days for his first postop visit.  He will be referred to hand therapy to start the rehab protocol.  He will be discharged to home with appropriate pain medication and discharge instructions.  Audria Nine, MD 6:40 PM

## 2022-01-13 NOTE — Discharge Instructions (Signed)
Stephen Schwartz, M.D. Hand Surgery  POST-OPERATIVE DISCHARGE INSTRUCTIONS   PRESCRIPTIONS: You may have been given a prescription to be taken as directed for post-operative pain control.  You may also take over the counter ibuprofen/aleve and tylenol for pain. Take this as directed on the packaging. Do not exceed 3000 mg tylenol/acetaminophen in 24 hours.  Ibuprofen 600-800 mg (3-4) tablets by mouth every 6 hours as needed for pain.  OR Aleve 2 tablets by mouth every 12 hours (twice daily) as needed for pain.  AND/OR Tylenol 1000 mg (2 tablets) every 8 hours as needed for pain.  Please use your pain medication carefully, as refills are limited and you may not be provided with one.  As stated above, please use over the counter pain medicine - it will also be helpful with decreasing your swelling.    ANESTHESIA: After your surgery, post-surgical discomfort or pain is likely. This discomfort can last several days to a few weeks. At certain times of the day your discomfort may be more intense.   Did you receive a nerve block?  A nerve block can provide pain relief for one hour to two days after your surgery. As long as the nerve block is working, you will experience little or no sensation in the area the surgeon operated on.  As the nerve block wears off, you will begin to experience pain or discomfort. It is very important that you begin taking your prescribed pain medication before the nerve block fully wears off. Treating your pain at the first sign of the block wearing off will ensure your pain is better controlled and more tolerable when full-sensation returns. Do not wait until the pain is intolerable, as the medicine will be less effective. It is better to treat pain in advance than to try and catch up.   General Anesthesia:  If you did not receive a nerve block during your surgery, you will need to start taking your pain medication shortly after your surgery and should continue  to do so as prescribed by your surgeon.     ICE AND ELEVATION: You may use ice for the first 48-72 hours, but it is not critical.   Motion of your fingers is very important to decrease the swelling.  Elevation, as much as possible for the next 48 hours, is critical for decreasing swelling as well as for pain relief. Elevation means when you are seated or lying down, you hand should be at or above your heart. When walking, the hand needs to be at or above the level of your elbow.  If the bandage gets too tight, it may need to be loosened. Please contact our office and we will instruct you in how to do this.    SURGICAL BANDAGES:  Keep your dressing and/or splint clean and dry at all times.  Do not remove until you are seen again in the office.  If careful, you may place a plastic bag over your bandage and tape the end to shower, but be careful, do not get your bandages wet.     HAND THERAPY:  You may not need any. If you do, we will begin this at your follow up visit in the clinic.    ACTIVITY AND WORK: You are encouraged to move any fingers which are not in the bandage.  Light use of the fingers is allowed to assist the other hand with daily hygiene and eating, but strong gripping or lifting is often uncomfortable and  should be avoided.  You might miss a variable period of time from work and hopefully this issue has been discussed prior to surgery. You may not do any heavy work with your affected hand for about 2 weeks.     [facility] [address] [place],  [place]  [address] [phone]  

## 2022-01-13 NOTE — Interval H&P Note (Signed)
History and Physical Interval Note:  01/13/2022 3:43 PM  Stephen Schwartz  has presented today for surgery, with the diagnosis of LEFT DISTAL RADIUS FRACTURE.  The various methods of treatment have been discussed with the patient and family. After consideration of risks, benefits and other options for treatment, the patient has consented to  Procedure(s): OPEN REDUCTION INTERNAL FIXATION (ORIF) DISTAL RADIAL FRACTURE (Left) as a surgical intervention.  The patient's history has been reviewed, patient examined, no change in status, stable for surgery.  I have reviewed the patient's chart and labs.  Questions were answered to the patient's satisfaction.     Alyson Ki [name]

## 2022-01-13 NOTE — Anesthesia Procedure Notes (Signed)
Anesthesia Regional Block: Supraclavicular block   Pre-Anesthetic Checklist: , timeout performed,  Correct Patient, Correct Site, Correct Laterality,  Correct Procedure, Correct Position, site marked,  Risks and benefits discussed,  Surgical consent,  Pre-op evaluation,  At surgeon's request and post-op pain management  Laterality: Left  Prep: chloraprep       Needles:  Injection technique: Single-shot  Needle Type: Echogenic Stimulator Needle     Needle Length: 9cm  Needle Gauge: 21     Additional Needles:   Procedures:, nerve stimulator,,, ultrasound used (permanent image in chart),,     Nerve Stimulator or Paresthesia:  Response: deltoid, bicep, tricep, thumb flexion, 0.5 mA  Additional Responses:   Narrative:  Start time: 01/13/2022 3:25 PM End time: 01/13/2022 3:33 PM Injection made incrementally with aspirations every 5 mL.  Performed by: Personally  Anesthesiologist: Suzette Battiest, MD

## 2022-01-18 ENCOUNTER — Encounter (HOSPITAL_COMMUNITY): Payer: Self-pay | Admitting: Orthopedic Surgery

## 2022-01-25 ENCOUNTER — Ambulatory Visit (INDEPENDENT_AMBULATORY_CARE_PROVIDER_SITE_OTHER): Payer: BC Managed Care – PPO | Admitting: Orthopedic Surgery

## 2022-01-25 ENCOUNTER — Ambulatory Visit (INDEPENDENT_AMBULATORY_CARE_PROVIDER_SITE_OTHER): Payer: BC Managed Care – PPO

## 2022-01-25 DIAGNOSIS — S52542A Smith's fracture of left radius, initial encounter for closed fracture: Secondary | ICD-10-CM | POA: Diagnosis not present

## 2022-01-25 NOTE — Progress Notes (Signed)
   Post-Op Visit Note   Patient: Stephen Schwartz           Date of Birth: Nov 17, 1970           MRN: [id number] Visit Date: 01/25/2022 PCP: Claretta Fraise, MD   Assessment & Plan:  Chief Complaint:  Chief Complaint  Patient presents with   Left Wrist - Routine Post Op   Visit Diagnoses:  1. Closed Smith's fracture of left radius, initial encounter     Plan: Patient is just under two weeks s/p ORIF of his left distal radius fracture.  He is doing well postoperatively.  His pain is well controlled.  His incision is clean and dry.  The sutures were removed.  He was put back into a volar splint until he can be seen by therapy.  I can see him back in another two weeks.   Follow-Up Instructions: No follow-ups on file.   Orders:  Orders Placed This Encounter  Procedures   XR Wrist Complete Left   Ambulatory referral to Occupational Therapy   No orders of the defined types were placed in this encounter.   Imaging: No results found.  PMFS History: Patient Active Problem List   Diagnosis Date Noted   Closed fracture of left distal radius 01/08/2022   Primary osteoarthritis of left hip 01/25/2020   Essential hypertension 03/31/2018   Encounter for Department of Transportation (DOT) examination for driving license renewal 50/56/9794   Psoriasis 12/08/2012   Past Medical History:  Diagnosis Date   Hip pain    History of nephrolithiasis    Hypertension    Psoriasis    Psoriasis     Family History  Problem Relation Age of Onset   Colon cancer Paternal Grandfather     Past Surgical History:  Procedure Laterality Date   COLONOSCOPY WITH PROPOFOL N/A 02/09/2021   Procedure: COLONOSCOPY WITH PROPOFOL;  Surgeon: Eloise Harman, DO;  [facility];  Service: Endoscopy;  Laterality: N/A;  ASA II / 9:45   NO PAST SURGERIES     OPEN REDUCTION INTERNAL FIXATION (ORIF) DISTAL RADIAL FRACTURE Left 01/13/2022   Procedure: OPEN REDUCTION INTERNAL FIXATION (ORIF) DISTAL RADIAL  FRACTURE;  Surgeon: Sherilyn Cooter, MD;  Location: [facility];  Service: Orthopedics;  Laterality: Left;   POLYPECTOMY  02/09/2021   Procedure: POLYPECTOMY;  Surgeon: Eloise Harman, DO;  [facility]: [facility];  Service: Endoscopy;;   Social History   Occupational History   Occupation: truck driver  Tobacco Use   Smoking status: Never   Smokeless tobacco: Never  Vaping Use   Vaping Use: Never used  Substance and Sexual Activity   Alcohol use: No   Drug use: No   Sexual activity: Not on file

## 2022-01-27 ENCOUNTER — Ambulatory Visit (INDEPENDENT_AMBULATORY_CARE_PROVIDER_SITE_OTHER): Payer: BC Managed Care – PPO | Admitting: Rehabilitative and Restorative Service Providers"

## 2022-01-27 ENCOUNTER — Encounter: Payer: Self-pay | Admitting: Rehabilitative and Restorative Service Providers"

## 2022-01-27 DIAGNOSIS — M25642 Stiffness of left hand, not elsewhere classified: Secondary | ICD-10-CM

## 2022-01-27 DIAGNOSIS — R600 Localized edema: Secondary | ICD-10-CM

## 2022-01-27 DIAGNOSIS — M25532 Pain in left wrist: Secondary | ICD-10-CM

## 2022-01-27 DIAGNOSIS — M25632 Stiffness of left wrist, not elsewhere classified: Secondary | ICD-10-CM | POA: Diagnosis not present

## 2022-01-27 DIAGNOSIS — M6281 Muscle weakness (generalized): Secondary | ICD-10-CM | POA: Diagnosis not present

## 2022-01-27 DIAGNOSIS — R278 Other lack of coordination: Secondary | ICD-10-CM

## 2022-01-27 NOTE — Therapy (Signed)
OUTPATIENT OCCUPATIONAL THERAPY ORTHO EVALUATION  Patient Name: Stephen Schwartz MRN: [id number] DOB:1971-02-17, 51 y.o., male Today's Date: 01/27/2022  PCP: Dr. Claretta Fraise REFERRING PROVIDER: Dr. Sherilyn Cooter   OT End of Session - 01/27/22 1352     Visit Number 1    Number of Visits 14    Date for OT Re-Evaluation 03/26/22    OT Start Time 2395    OT Stop Time 1500    OT Time Calculation (min) 68 min    Equipment Utilized During Treatment orthotic materials    Activity Tolerance Patient tolerated treatment well;No increased pain;Patient limited by fatigue;Patient limited by pain             Past Medical History:  Diagnosis Date   Hip pain    History of nephrolithiasis    Hypertension    Psoriasis    Psoriasis    Past Surgical History:  Procedure Laterality Date   COLONOSCOPY WITH PROPOFOL N/A 02/09/2021   Procedure: COLONOSCOPY WITH PROPOFOL;  Surgeon: Eloise Harman, DO;  [facility];  Service: Endoscopy;  Laterality: N/A;  ASA II / 9:45   NO PAST SURGERIES     OPEN REDUCTION INTERNAL FIXATION (ORIF) DISTAL RADIAL FRACTURE Left 01/13/2022   Procedure: OPEN REDUCTION INTERNAL FIXATION (ORIF) DISTAL RADIAL FRACTURE;  Surgeon: Sherilyn Cooter, MD;  Location: [facility];  Service: Orthopedics;  Laterality: Left;   POLYPECTOMY  02/09/2021   Procedure: POLYPECTOMY;  Surgeon: Eloise Harman, DO;  [facility];  Service: Endoscopy;;   Patient Active Problem List   Diagnosis Date Noted   Closed fracture of left distal radius 01/08/2022   Primary osteoarthritis of left hip 01/25/2020   Essential hypertension 03/31/2018   Encounter for Department of Transportation (DOT) examination for driving license renewal 32/09/3341   Psoriasis 12/08/2012    ONSET DATE: DOS 01/13/22  REFERRING DIAG: H68.616O (ICD-10-CM) - Closed Smith's fracture of left radius, initial encounter  THERAPY DIAG:  Pain in left wrist  Muscle weakness  (generalized)  Localized edema  Other lack of coordination  Stiffness of left hand, not elsewhere classified  Stiffness of left wrist, not elsewhere classified  Rationale for Evaluation and Treatment Rehabilitation  SUBJECTIVE:   SUBJECTIVE STATEMENT: He states falling from the back of a truck bed and landing on outstretches arm, breaking distal radius. 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 He states soreness and stiffness now (arrives in bulky dressing), and inability to do normal ADLs.    PERTINENT HISTORY: Per MD: "Left distal radius fracture s/p ORIF"  PRECAUTIONS: 2 weeks post op now, NWB in Left arm   WEIGHT BEARING RESTRICTIONS Yes NWB in left hand   PAIN:  Are you having pain? Yes Rating: 4/10 at rest now  FALLS: Has patient fallen in last 6 months? Yes. Number of falls 1 (this one)  PLOF: Independent  PATIENT GOALS: to get a tight fist back   OBJECTIVE:   HAND DOMINANCE: Right   ADLs: Overall ADLs: States decreased ability to grab, hold household objects, pain and inability to open containers, perform all FMS tasks, etc.    FUNCTIONAL OUTCOME MEASURES: Eval: Patient Specific Functional Scale: 3.6 (tying shoes, open bottles/containers, use utensils)  UPPER EXTREMITY ROM    01/27/22: about 30* flexion at all DIP Js now, other joints ~80-90*, looks pretty good. Doesn't have tight fist yet, can oppose to SF pad, but not to base of small finger.   Active ROM  Left eval  Wrist flexion 30  Wrist extension 31  Wrist pronation 75  Wrist supination 68  (Blank rows = not tested)   UPPER EXTREMITY MMT:   Eval: not appropriate  MMT Left TBD  Elbow flexion   Elbow extension   Wrist flexion   Wrist extension   Wrist ulnar deviation   Wrist radial deviation   Wrist pronation   Wrist supination   (Blank rows = not tested)  HAND FUNCTION: Eval: weak/painful in left hand now Grip strength Right: TBD lbs, Left: TBD lbs    COORDINATION: Eval: 9 Hole Peg Test Left: 32 sec (25 sec is WFL)   SENSATION: Eval: Light touch intact today, though diminished around sx area    EDEMA:   Eval:  No significant swelling in hand and wrist today, 21.8cm circumferentially around left MCP Js (22.4 in right hand)   COGNITION: Overall cognitive status: WFL for evaluation today   OBSERVATIONS:   Eval: He is tender to PROM today, encouraged not to "push" too fast to limit swelling and complications   TODAY'S TREATMENT:  Eval: Custom orthotic fabrication was indicated due to pt's healing left wrist and need for safe, functional positioning. OT fabricated custom wrist immobilization orthotic for pt today to protect sx site and fx. It fit well with no areas of pressure, pt states a comfortable fit. Pt was educated on the wearing schedule, to call or come in ASAP if it is causing any irritation or is not achieving desired function. It will be checked/adjusted in upcoming sessions, as needed. Pt states understanding.   OT also edu on first HEP for motions and stretches from shoulder to fingers as tolerated. He was a bit tender with PROM and was encouraged not to push too much. Also edu for home/work safety, no lifting mor ethan 1-2#, as he states desire to RTW driving. Also edu for self-care wound care, cleanliness, etc. He states understanding.   Exercises - Standing Shoulder Flexion Full Range  - 4-6 x daily - 1 sets - 10-15 reps - Palm Up / Palm Down  - 3-4 x daily - 1-2 sets - 10-15 reps - Bend and Pull Back Wrist SLOWLY  - 3-4 x daily - 1-2 sets - 10-15 reps - "Windshield Wipers"   - 3-4 x daily - 1-2 sets - 10-15 reps - Touch Thumb to [facility] Finger  - 3-4 x daily - 1-2 sets - 10 reps - Tendon Glides  - 3-4 x daily - 3-5 reps - 2-3 seconds hold - Seated Wrist Flexion with Overpressure  - 4-6 x daily - 3-5 reps - 15 sec hold - Wrist Extension Stretch Pronated  - 3-4 x daily - 3-5 reps - 15 hold - Wrist Extension Stretch  Pronated  - 3-4 x daily - 3-5 reps - 15 hold - Seated Thumb MP Flexion PROM  - 4-6 x daily - 1 sets - 10-15 reps - Seated Finger Composite Flexion Stretch  - 4-6 x daily - 3-5 reps - 15 hold  Patient Education - Scar Massage   PATIENT EDUCATION: Education details: See tx section above for details  Person educated: Patient Education method: Verbal Instruction, Teach back, Handouts  Education comprehension: States and demonstrates understanding, Additional Education required    HOME EXERCISE PROGRAM: Access Code: [email or web address] URL: [email or web address] Date: 01/27/2022 Prepared by: Benito Mccreedy   GOALS: Goals reviewed with patient? Yes   SHORT TERM GOALS: (STG required if POC>30 days)  Pt will obtain protective, custom orthotic.  Target date: 01/27/22 Goal status: MET  2.  Pt will demo/state understanding of initial HEP to improve pain levels and prerequisite motion. Target date: 02/05/22 Goal status: INITIAL   LONG TERM GOALS:  Pt will improve functional ability by decreased impairment per PSFS assessment from 3.6 to 8 or better, for better quality of life. Target date: 03/26/22 Goal status: INITIAL  2.  Pt will improve grip strength in left hand to at least 50 lbs for functional use at home and in IADLs. Target date: 03/26/22 Goal status: INITIAL  3.  Pt will improve A/ROM in left wrist flex & ext from ~30* each to at least 50* each, to have functional motion for tasks like reach and grasp.  Target date: 03/26/22 Goal status: INITIAL  4.  Pt will improve strength in left wrist flex & ext from 3-/5 MMT to at least 4+/5 MMT to have increased functional ability to carry out selfcare and higher-level homecare tasks with no difficulty. Target date: 03/26/22 Goal status: INITIAL   ASSESSMENT:  CLINICAL IMPRESSION: Patient is a 51 y.o. male who was seen today for occupational therapy evaluation for broken left wrist and decreased functional ability.    PERFORMANCE DEFICITS in functional skills including ADLs, IADLs, coordination, dexterity, ROM, strength, pain, fascial restrictions, flexibility, FMC, GMC, body mechanics, endurance, decreased knowledge of precautions, skin integrity, and UE functional use, cognitive skills including safety awareness, and psychosocial skills including coping strategies, environmental adaptation, and habits.   IMPAIRMENTS are limiting patient from ADLs, IADLs, work, play, leisure, and social participation.   COMORBIDITIES may have co-morbidities  that affects occupational performance. Patient will benefit from skilled OT to address above impairments and improve overall function.  MODIFICATION OR ASSISTANCE TO COMPLETE EVALUATION: No modification of tasks or assist necessary to complete an evaluation.  OT OCCUPATIONAL PROFILE AND HISTORY: Problem focused assessment: Including review of records relating to presenting problem.  CLINICAL DECISION MAKING: Moderate - several treatment options, min-mod task modification necessary  REHAB POTENTIAL: Excellent  EVALUATION COMPLEXITY: Low     PLAN: OT FREQUENCY: 2x/week  OT DURATION: 8 weeks (through 03/26/22)  PLANNED INTERVENTIONS: self care/ADL training, therapeutic exercise, therapeutic activity, neuromuscular re-education, manual therapy, scar mobilization, passive range of motion, splinting, ultrasound, fluidotherapy, compression bandaging, moist heat, cryotherapy, contrast bath, patient/family education, coping strategies training, and DME and/or AE instructions  RECOMMENDED OTHER SERVICES: none now   CONSULTED AND AGREED WITH PLAN OF CARE: Patient  PLAN FOR NEXT SESSION: check orthotic, check initial HEP and progress, upgrade per protocols and as tolerated.    Benito Mccreedy, OTR/L, CHT 01/27/2022, 3:13 PM

## 2022-02-03 ENCOUNTER — Ambulatory Visit (INDEPENDENT_AMBULATORY_CARE_PROVIDER_SITE_OTHER): Payer: BC Managed Care – PPO | Admitting: Rehabilitative and Restorative Service Providers"

## 2022-02-03 ENCOUNTER — Encounter: Payer: Self-pay | Admitting: Rehabilitative and Restorative Service Providers"

## 2022-02-03 DIAGNOSIS — M25642 Stiffness of left hand, not elsewhere classified: Secondary | ICD-10-CM

## 2022-02-03 DIAGNOSIS — M25632 Stiffness of left wrist, not elsewhere classified: Secondary | ICD-10-CM

## 2022-02-03 DIAGNOSIS — M6281 Muscle weakness (generalized): Secondary | ICD-10-CM

## 2022-02-03 DIAGNOSIS — M25532 Pain in left wrist: Secondary | ICD-10-CM

## 2022-02-03 DIAGNOSIS — R600 Localized edema: Secondary | ICD-10-CM | POA: Diagnosis not present

## 2022-02-03 DIAGNOSIS — R278 Other lack of coordination: Secondary | ICD-10-CM

## 2022-02-03 NOTE — Therapy (Signed)
OUTPATIENT OCCUPATIONAL THERAPY TREATMENT NOTE   Patient Name: Stephen Schwartz MRN: [id number] DOB:1970-08-26, 51 y.o., male Today's Date: 02/03/2022  PCP: Dr. Claretta Fraise REFERRING PROVIDER: Dr. Sherilyn Cooter  END OF SESSION:   OT End of Session - 02/03/22 1151     Visit Number 2    Number of Visits 14    Date for OT Re-Evaluation 03/26/22    OT Start Time 1151    OT Stop Time 1231    OT Time Calculation (min) 40 min    Equipment Utilized During Treatment orthotic materials    Activity Tolerance Patient tolerated treatment well;No increased pain;Patient limited by fatigue;Patient limited by pain    Behavior During Therapy [facility] for tasks assessed/performed             Past Medical History:  Diagnosis Date   Hip pain    History of nephrolithiasis    Hypertension    Psoriasis    Psoriasis    Past Surgical History:  Procedure Laterality Date   COLONOSCOPY WITH PROPOFOL N/A 02/09/2021   Procedure: COLONOSCOPY WITH PROPOFOL;  Surgeon: Eloise Harman, DO;  [facility];  Service: Endoscopy;  Laterality: N/A;  ASA II / 9:45   NO PAST SURGERIES     OPEN REDUCTION INTERNAL FIXATION (ORIF) DISTAL RADIAL FRACTURE Left 01/13/2022   Procedure: OPEN REDUCTION INTERNAL FIXATION (ORIF) DISTAL RADIAL FRACTURE;  Surgeon: Sherilyn Cooter, MD;  Location: [place];  Service: Orthopedics;  Laterality: Left;   POLYPECTOMY  02/09/2021   Procedure: POLYPECTOMY;  Surgeon: Eloise Harman, DO;  [facility];  Service: Endoscopy;;   Patient Active Problem List   Diagnosis Date Noted   Closed fracture of left distal radius 01/08/2022   Primary osteoarthritis of left hip 01/25/2020   Essential hypertension 03/31/2018   Encounter for Department of Transportation (DOT) examination for driving license renewal 70/35/0093   Psoriasis 12/08/2012    ONSET DATE: DOS 01/13/22   REFERRING DIAG: G18.299B (ICD-10-CM) - Closed Smith's fracture of left radius, initial  encounter  THERAPY DIAG:  Pain in left wrist  Muscle weakness (generalized)  Localized edema  Stiffness of left wrist, not elsewhere classified  Stiffness of left hand, not elsewhere classified  Other lack of coordination  Rationale for Evaluation and Treatment Rehabilitation  PERTINENT HISTORY: Per MD: "Left distal radius fracture s/p ORIF"   PRECAUTIONS: 3 weeks post op now, NWB in Left arm, A/A/PROM ok now, weighted stretches at 4 weeks, 5-6 weeks ok for hand strength as tol, 6-8 weeks for wrist strength, return to full activities by 10-12 weeks   WEIGHT BEARING RESTRICTIONS Yes NWB in left hand   SUBJECTIVE:  He states doing HEP and not having much pain, orthotic rubbing very slightly at thumb. He's been avoiding weight bearing as asked, etc.   PAIN:  Are you having pain? No Rating: 0/10 at rest now    OBJECTIVE: (All objective assessments below are from initial evaluation on: 01/27/22 unless otherwise specified.)   HAND DOMINANCE: Right    ADLs: Overall ADLs: States decreased ability to grab, hold household objects, pain and inability to open containers, perform all FMS tasks, etc.      FUNCTIONAL OUTCOME MEASURES: Eval: Patient Specific Functional Scale: 3.6 (tying shoes, open bottles/containers, use utensils)   UPPER EXTREMITY ROM    01/27/22: about 30* flexion at all DIP Js now, other joints ~80-90*, looks pretty good. Doesn't have tight fist yet, can oppose to SF pad, but not to  base of small finger.   02/02/22: full fist just somewhat loose still.  DIP J s are similar Lt to Rt    Active ROM Left eval Left 02/03/22  Wrist flexion 30 41  Wrist extension 31 31  Wrist pronation 75 75  Wrist supination 68 71  (Blank rows = not tested)     UPPER EXTREMITY MMT:   Eval: not appropriate   MMT Left TBD  Elbow flexion    Elbow extension    Wrist flexion    Wrist extension    Wrist ulnar deviation    Wrist radial deviation    Wrist pronation    Wrist  supination    (Blank rows = not tested)   HAND FUNCTION: Eval: weak/painful in left hand now Grip strength Right: TBD lbs, Left: TBD lbs    COORDINATION: Eval: 9 Hole Peg Test Left: 32 sec (25 sec is WFL)    SENSATION: Eval: Light touch intact today, though diminished around sx area     EDEMA:             Eval:  No significant swelling in hand and wrist today, 21.8cm circumferentially around left MCP Js (22.4 in right hand)    COGNITION: Overall cognitive status: WFL for evaluation today    OBSERVATIONS:             Eval: He is tender to PROM today, encouraged not to "push" too fast to limit swelling and complications     TODAY'S TREATMENT:  02/02/22: Orthotic was slightly adjusted to relieve pressure at thumb. He performs AROM for exercises/review as well as new measures and shows some mild improvement today.  OT then does manual therapy (IASTM to volar FA, stretches in FA, wrist, thumb, fingers in all planes) with verbal review of how he should be doing at home. He shows some hypersensitivity to distal sx area, so scar massages and desensitization was discussed again. He learns new FA stretches which were added to HEP now.  He demo's all back well and states understanding to POC, to return in 1 week (allow healing time).   Eval: Custom orthotic fabrication was indicated due to pt's healing left wrist and need for safe, functional positioning. OT fabricated custom wrist immobilization orthotic for pt today to protect sx site and fx. It fit well with no areas of pressure, pt states a comfortable fit. Pt was educated on the wearing schedule, to call or come in ASAP if it is causing any irritation or is not achieving desired function. It will be checked/adjusted in upcoming sessions, as needed. Pt states understanding.    OT also edu on first HEP for motions and stretches from shoulder to fingers as tolerated. He was a bit tender with PROM and was encouraged not to push too much. 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 Also edu for self-care wound care, cleanliness, etc. He states understanding.    Exercises - Standing Shoulder Flexion Full Range  - 4-6 x daily - 1 sets - 10-15 reps - Palm Up / Palm Down  - 3-4 x daily - 1-2 sets - 10-15 reps - Bend and Pull Back Wrist SLOWLY  - 3-4 x daily - 1-2 sets - 10-15 reps - "Windshield Wipers"   - 3-4 x daily - 1-2 sets - 10-15 reps - Touch Thumb to [facility] Finger  - 3-4 x daily - 1-2 sets - 10 reps - Tendon  Glides  - 3-4 x daily - 3-5 reps - 2-3 seconds hold - Seated Wrist Flexion with Overpressure  - 4-6 x daily - 3-5 reps - 15 sec hold - Wrist Extension Stretch Pronated  - 3-4 x daily - 3-5 reps - 15 hold - Wrist Extension Stretch Pronated  - 3-4 x daily - 3-5 reps - 15 hold - Seated Thumb MP Flexion PROM  - 4-6 x daily - 1 sets - 10-15 reps - Seated Finger Composite Flexion Stretch  - 4-6 x daily - 3-5 reps - 15 hold   Patient Education - Scar Massage     PATIENT EDUCATION: Education details: See tx section above for details  Person educated: Patient Education method: Verbal Instruction, Teach back, Handouts  Education comprehension: States and demonstrates understanding, Additional Education required      HOME EXERCISE PROGRAM: Access Code: [email or web address] URL: [email or web address] Date: 01/27/2022 Prepared by: Benito Mccreedy     GOALS: Goals reviewed with patient? Yes     SHORT TERM GOALS: (STG required if POC>30 days)   Pt will obtain protective, custom orthotic. Target date: 01/27/22 Goal status: MET   2.  Pt will demo/state understanding of initial HEP to improve pain levels and prerequisite motion. Target date: 02/05/22 Goal status: INITIAL     LONG TERM GOALS:   Pt will improve functional ability by decreased impairment per PSFS assessment from 3.6 to 8 or better, for better quality of life. Target date: 03/26/22 Goal status: INITIAL   2.   Pt will improve grip strength in left hand to at least 50 lbs for functional use at home and in IADLs. Target date: 03/26/22 Goal status: INITIAL   3.  Pt will improve A/ROM in left wrist flex & ext from ~30* each to at least 50* each, to have functional motion for tasks like reach and grasp.  Target date: 03/26/22 Goal status: INITIAL   4.  Pt will improve strength in left wrist flex & ext from 3-/5 MMT to at least 4+/5 MMT to have increased functional ability to carry out selfcare and higher-level homecare tasks with no difficulty. Target date: 03/26/22 Goal status: INITIAL     ASSESSMENT:   CLINICAL IMPRESSION: 02/03/22: He is still very stiff, some hypersensitivity, will continue his HEP and try to gently push a bit. Will upgrade HEP next week to weighted stretches as tolerated.   Eval: Patient is a 51 y.o. male who was seen today for occupational therapy evaluation for broken left wrist and decreased functional ability.       PLAN: OT FREQUENCY: 2x/week   OT DURATION: 8 weeks (through 03/26/22)   PLANNED INTERVENTIONS: self care/ADL training, therapeutic exercise, therapeutic activity, neuromuscular re-education, manual therapy, scar mobilization, passive range of motion, splinting, ultrasound, fluidotherapy, compression bandaging, moist heat, cryotherapy, contrast bath, patient/family education, coping strategies training, and DME and/or AE instructions   RECOMMENDED OTHER SERVICES: none now    CONSULTED AND AGREED WITH PLAN OF CARE: Patient   PLAN FOR NEXT SESSION:  check orthotic, upgrade HEP next week to weighted stretches as tolerated.    Benito Mccreedy, OTR/L, CHT 02/03/2022, 2:29 PM

## 2022-02-09 ENCOUNTER — Ambulatory Visit (INDEPENDENT_AMBULATORY_CARE_PROVIDER_SITE_OTHER): Payer: BC Managed Care – PPO | Admitting: Orthopedic Surgery

## 2022-02-09 ENCOUNTER — Encounter: Payer: Self-pay | Admitting: Rehabilitative and Restorative Service Providers"

## 2022-02-09 ENCOUNTER — Ambulatory Visit (INDEPENDENT_AMBULATORY_CARE_PROVIDER_SITE_OTHER): Payer: BC Managed Care – PPO | Admitting: Rehabilitative and Restorative Service Providers"

## 2022-02-09 ENCOUNTER — Ambulatory Visit (INDEPENDENT_AMBULATORY_CARE_PROVIDER_SITE_OTHER): Payer: BC Managed Care – PPO

## 2022-02-09 DIAGNOSIS — M25632 Stiffness of left wrist, not elsewhere classified: Secondary | ICD-10-CM

## 2022-02-09 DIAGNOSIS — S52542A Smith's fracture of left radius, initial encounter for closed fracture: Secondary | ICD-10-CM

## 2022-02-09 DIAGNOSIS — R278 Other lack of coordination: Secondary | ICD-10-CM | POA: Diagnosis not present

## 2022-02-09 DIAGNOSIS — R600 Localized edema: Secondary | ICD-10-CM | POA: Diagnosis not present

## 2022-02-09 DIAGNOSIS — M6281 Muscle weakness (generalized): Secondary | ICD-10-CM

## 2022-02-09 DIAGNOSIS — M25532 Pain in left wrist: Secondary | ICD-10-CM | POA: Diagnosis not present

## 2022-02-09 DIAGNOSIS — M25642 Stiffness of left hand, not elsewhere classified: Secondary | ICD-10-CM

## 2022-02-17 ENCOUNTER — Encounter: Payer: Self-pay | Admitting: Rehabilitative and Restorative Service Providers"

## 2022-02-17 ENCOUNTER — Ambulatory Visit (INDEPENDENT_AMBULATORY_CARE_PROVIDER_SITE_OTHER): Payer: BC Managed Care – PPO | Admitting: Rehabilitative and Restorative Service Providers"

## 2022-02-17 DIAGNOSIS — M25642 Stiffness of left hand, not elsewhere classified: Secondary | ICD-10-CM | POA: Diagnosis not present

## 2022-02-17 DIAGNOSIS — M6281 Muscle weakness (generalized): Secondary | ICD-10-CM

## 2022-02-17 DIAGNOSIS — M25632 Stiffness of left wrist, not elsewhere classified: Secondary | ICD-10-CM | POA: Diagnosis not present

## 2022-02-17 DIAGNOSIS — R600 Localized edema: Secondary | ICD-10-CM | POA: Diagnosis not present

## 2022-02-17 DIAGNOSIS — M25532 Pain in left wrist: Secondary | ICD-10-CM

## 2022-02-17 DIAGNOSIS — R278 Other lack of coordination: Secondary | ICD-10-CM

## 2022-02-17 NOTE — Therapy (Signed)
OUTPATIENT OCCUPATIONAL THERAPY TREATMENT NOTE   Patient Name: Stephen Schwartz MRN: [id number] DOB:03-05-1971, 51 y.o., male Today's Date: 02/17/2022  PCP: Dr. Claretta Fraise REFERRING PROVIDER: Dr. Sherilyn Cooter  END OF SESSION:   OT End of Session - 02/17/22 1150     Visit Number 4    Number of Visits 14    Date for OT Re-Evaluation 03/26/22    OT Start Time 1151    OT Stop Time 1229    OT Time Calculation (min) 38 min    Equipment Utilized During Treatment yellow putty    Activity Tolerance Patient tolerated treatment well;No increased pain;Patient limited by fatigue    Behavior During Therapy [facility] for tasks assessed/performed               Past Medical History:  Diagnosis Date   Hip pain    History of nephrolithiasis    Hypertension    Psoriasis    Psoriasis    Past Surgical History:  Procedure Laterality Date   COLONOSCOPY WITH PROPOFOL N/A 02/09/2021   Procedure: COLONOSCOPY WITH PROPOFOL;  Surgeon: Eloise Harman, DO;  [facility];  Service: Endoscopy;  Laterality: N/A;  ASA II / 9:45   NO PAST SURGERIES     OPEN REDUCTION INTERNAL FIXATION (ORIF) DISTAL RADIAL FRACTURE Left 01/13/2022   Procedure: OPEN REDUCTION INTERNAL FIXATION (ORIF) DISTAL RADIAL FRACTURE;  Surgeon: Sherilyn Cooter, MD;  Location: [place];  Service: Orthopedics;  Laterality: Left;   POLYPECTOMY  02/09/2021   Procedure: POLYPECTOMY;  Surgeon: Eloise Harman, DO;  [facility];  Service: Endoscopy;;   Patient Active Problem List   Diagnosis Date Noted   Closed fracture of left distal radius 01/08/2022   Primary osteoarthritis of left hip 01/25/2020   Essential hypertension 03/31/2018   Encounter for Department of Transportation (DOT) examination for driving license renewal 15/40/0867   Psoriasis 12/08/2012    ONSET DATE: DOS 01/13/22   REFERRING DIAG: Y19.509T (ICD-10-CM) - Closed Smith's fracture of left radius, initial encounter  THERAPY DIAG:  Pain in  left wrist  Localized edema  Stiffness of left hand, not elsewhere classified  Stiffness of left wrist, not elsewhere classified  Muscle weakness (generalized)  Other lack of coordination  Rationale for Evaluation and Treatment Rehabilitation  PERTINENT HISTORY: Per MD: "Left distal radius fracture s/p ORIF"   PRECAUTIONS: 5 weeks post op now, NWB in Left arm, A/A/PROM ok now, weighted stretches at 4 weeks, 5-6 weeks ok for hand strength as tol, 6-8 weeks for wrist strength, return to full activities by 10-12 weeks   WEIGHT BEARING RESTRICTIONS Yes NWB in left hand   SUBJECTIVE:  He states doing well with HEP, just starting work, not much pain but not much time for HEP either. He is encouraged to find time for HEP or he may stay somewhat more stiff.    PAIN:  Are you having pain? Yes, only aching  Rating: 2/10 at rest now    OBJECTIVE: (All objective assessments below are from initial evaluation on: 01/27/22 unless otherwise specified.)   HAND DOMINANCE: Right    ADLs: Overall ADLs: States decreased ability to grab, hold household objects, pain and inability to open containers, perform all FMS tasks, etc.      FUNCTIONAL OUTCOME MEASURES: Eval: Patient Specific Functional Scale: 3.6 (tying shoes, open bottles/containers, use utensils)   UPPER EXTREMITY ROM    01/27/22: about 30* flexion at all DIP Js now, other joints ~80-90*, looks pretty  good. Doesn't have tight fist yet, can oppose to SF pad, but not to base of small finger.   02/02/22: full fist just somewhat loose still.  DIP J s are similar Lt to Rt    Active ROM Left eval Left 02/03/22 Left 02/09/22  Wrist flexion 30 41 32  Wrist extension '31 31 31  ' Wrist pronation 75 75 82  Wrist supination 68 71 70  (Blank rows = not tested)     UPPER EXTREMITY MMT:   Eval: not appropriate   MMT Left TBD  Elbow flexion    Elbow extension    Wrist flexion    Wrist extension    Wrist ulnar deviation    Wrist radial  deviation    Wrist pronation    Wrist supination    (Blank rows = not tested)   HAND FUNCTION: Eval: weak/painful in left hand now Grip strength Right: TBD lbs, Left: TBD lbs    COORDINATION: Eval: 9 Hole Peg Test Left: 32 sec (25 sec is WFL)    SENSATION: Eval: Light touch intact today, though diminished around sx area     EDEMA:             Eval:  No significant swelling in hand and wrist today, 21.8cm circumferentially around left MCP Js (22.4 in right hand)    COGNITION: Overall cognitive status: WFL for evaluation today    OBSERVATIONS:             Eval: He is tender to PROM today, encouraged not to "push" too fast to limit swelling and complications     TODAY'S TREATMENT:  02/17/22: OT reviews HEP with him while he is doing AROM in fluidotherapy machine for warmup and there ex. OT then does manual stretches with review at FA, wrist, thumb, fingers, in all planes.  OT then edu for upgrade HEP to hand strength activities with yellow putty today as below. He tolerates all well, only mild soreness at end and no questions. He was also edu to leave off orthotic when at home more often now, but still avoid heavy/ painful weight in hand.   Exercises - Touch Thumb to [facility] Finger  - 3-4 x daily - 1-2 sets - 10 reps - Tendon Glides  - 3-4 x daily - 3-5 reps - 2-3 seconds hold - Wrist Pronation Stretch  - 4-6 x daily - 1 sets - 10-15 reps - Forearm Supination Stretch  - 3-4 x daily - 3-5 reps - 15 sec hold - Seated Wrist Flexion with Overpressure  - 4-6 x daily - 3-5 reps - 15 sec hold - Wrist Extension Stretch Pronated  - 3-4 x daily - 3-5 reps - 15 hold - Seated Thumb MP Flexion PROM  - 4-6 x daily - 1 sets - 10-15 reps - Seated Finger Composite Flexion Stretch  - 4-6 x daily - 3-5 reps - 15 hold - ECCENTRIC HAMMER (SLOWLY LET DOWN)   - 2-3 x daily - 1-2 sets - 5-15 reps - Full Fist  - 2-3 x daily - 5 reps - Seated Claw Fist with Putty  - 2-3 x daily - 5 reps - "Duck Mouth" Strength   - 2-3 x daily - 5 reps - Finger Extension "Pizza!"   - 2-3 x daily - 5 reps - Thumb Press  - 2-3 x daily - 5 reps - Thumb Opposition with Putty  - 2-3 x daily - 5 reps  02/09/22: He does AROM for new measures,  seems a bit tighter today and is cautioned to keep a high frequency of stretches at home with HEP. OT reviews full HEP with him, performs manual stretches to FA and wrist in 6 planes, with HEP review, then upgrades to weighted stretches at [facility] and wrist with hammer or 1-2 #. He tolerates with no added pain and states understanding needing to stretch more often at home since he is only choosing to come in 1xweek to therapy.     PATIENT EDUCATION: Education details: See tx section above for details  Person educated: Patient Education method: Verbal Instruction, Teach back, Handouts  Education comprehension: States and demonstrates understanding, Additional Education required      HOME EXERCISE PROGRAM: Access Code: [email or web address] URL: [email or web address] Date: 01/27/2022 Prepared by: Benito Mccreedy     GOALS: Goals reviewed with patient? Yes     SHORT TERM GOALS: (STG required if POC>30 days)   Pt will obtain protective, custom orthotic. Target date: 01/27/22 Goal status: MET   2.  Pt will demo/state understanding of initial HEP to improve pain levels and prerequisite motion. Target date: 02/05/22 Goal status: INITIAL     LONG TERM GOALS:   Pt will improve functional ability by decreased impairment per PSFS assessment from 3.6 to 8 or better, for better quality of life. Target date: 03/26/22 Goal status: INITIAL   2.  Pt will improve grip strength in left hand to at least 50 lbs for functional use at home and in IADLs. Target date: 03/26/22 Goal status: INITIAL   3.  Pt will improve A/ROM in left wrist flex & ext from ~30* each to at least 50* each, to have functional motion for tasks like reach and grasp.  Target date: 03/26/22 Goal status: INITIAL   4.   Pt will improve strength in left wrist flex & ext from 3-/5 MMT to at least 4+/5 MMT to have increased functional ability to carry out selfcare and higher-level homecare tasks with no difficulty. Target date: 03/26/22 Goal status: INITIAL     ASSESSMENT:   CLINICAL IMPRESSION: 02/17/22: Doing very well still motion and tolerance and now strength improving.   02/09/22: He was a bit tighter motion today but no hypersensitivity now, and tolerance to activity is better. Carry on.   02/03/22: He is still very stiff, some hypersensitivity, will continue his HEP and try to gently push a bit. Will upgrade HEP next week to weighted stretches as tolerated.   Eval: Patient is a 51 y.o. male who was seen today for occupational therapy evaluation for broken left wrist and decreased functional ability.       PLAN: OT FREQUENCY: 2x/week   OT DURATION: 8 weeks (through 03/26/22)   PLANNED INTERVENTIONS: self care/ADL training, therapeutic exercise, therapeutic activity, neuromuscular re-education, manual therapy, scar mobilization, passive range of motion, splinting, ultrasound, fluidotherapy, compression bandaging, moist heat, cryotherapy, contrast bath, patient/family education, coping strategies training, and DME and/or AE instructions   RECOMMENDED OTHER SERVICES: none now    CONSULTED AND AGREED WITH PLAN OF CARE: Patient   PLAN FOR NEXT SESSION:  Check hand strength and motion, add wrist strength as tolerated.   Benito Mccreedy, OTR/L, CHT 02/17/2022, 12:31 PM

## 2022-02-24 ENCOUNTER — Encounter: Payer: BC Managed Care – PPO | Admitting: Rehabilitative and Restorative Service Providers"

## 2022-03-12 ENCOUNTER — Ambulatory Visit (INDEPENDENT_AMBULATORY_CARE_PROVIDER_SITE_OTHER): Payer: BC Managed Care – PPO | Admitting: Orthopedic Surgery

## 2022-03-12 ENCOUNTER — Ambulatory Visit (INDEPENDENT_AMBULATORY_CARE_PROVIDER_SITE_OTHER): Payer: BC Managed Care – PPO

## 2022-03-12 DIAGNOSIS — S52542A Smith's fracture of left radius, initial encounter for closed fracture: Secondary | ICD-10-CM

## 2022-03-12 NOTE — Progress Notes (Signed)
   Post-Op Visit Note   Patient: Stephen Schwartz           Date of Birth: 1971/07/24           MRN: [id number] Visit Date: 03/12/2022 PCP: Claretta Fraise, MD   Assessment & Plan:  Chief Complaint:  Chief Complaint  Patient presents with   Left Wrist - Follow-up, Fracture   Visit Diagnoses:  1. Closed Smith's fracture of left radius, initial encounter     Plan: Patient is 8 weeks s/p ORIF left volar barton distal radius fracture.  He's doing very well today.  He has no pain.  He has full ROM of his fingers, wrist, and forearm.  Repeat x-rays show acceptable fracture alignment with unchanged hardware position.  He will see me back in another month or so once he completes therapy.   Follow-Up Instructions: No follow-ups on file.   Orders:  Orders Placed This Encounter  Procedures   XR Wrist Complete Left   No orders of the defined types were placed in this encounter.   Imaging: No results found.  PMFS History: Patient Active Problem List   Diagnosis Date Noted   Closed fracture of left distal radius 01/08/2022   Primary osteoarthritis of left hip 01/25/2020   Essential hypertension 03/31/2018   Encounter for Department of Transportation (DOT) examination for driving license renewal 52/84/1324   Psoriasis 12/08/2012   Past Medical History:  Diagnosis Date   Hip pain    History of nephrolithiasis    Hypertension    Psoriasis    Psoriasis     Family History  Problem Relation Age of Onset   Colon cancer Paternal Grandfather     Past Surgical History:  Procedure Laterality Date   COLONOSCOPY WITH PROPOFOL N/A 02/09/2021   Procedure: COLONOSCOPY WITH PROPOFOL;  Surgeon: Eloise Harman, DO;  [facility];  Service: Endoscopy;  Laterality: N/A;  ASA II / 9:45   NO PAST SURGERIES     OPEN REDUCTION INTERNAL FIXATION (ORIF) DISTAL RADIAL FRACTURE Left 01/13/2022   Procedure: OPEN REDUCTION INTERNAL FIXATION (ORIF) DISTAL RADIAL FRACTURE;  Surgeon: Sherilyn Cooter, MD;  Location: [place];  Service: Orthopedics;  Laterality: Left;   POLYPECTOMY  02/09/2021   Procedure: POLYPECTOMY;  Surgeon: Eloise Harman, DO;  [facility];  Service: Endoscopy;;   Social History   Occupational History   Occupation: truck driver  Tobacco Use   Smoking status: Never   Smokeless tobacco: Never  Vaping Use   Vaping Use: Never used  Substance and Sexual Activity   Alcohol use: No   Drug use: No   Sexual activity: Not on file

## 2022-03-24 ENCOUNTER — Encounter: Payer: Self-pay | Admitting: Rehabilitative and Restorative Service Providers"

## 2022-03-24 ENCOUNTER — Ambulatory Visit (INDEPENDENT_AMBULATORY_CARE_PROVIDER_SITE_OTHER): Payer: BC Managed Care – PPO | Admitting: Rehabilitative and Restorative Service Providers"

## 2022-03-24 DIAGNOSIS — M6281 Muscle weakness (generalized): Secondary | ICD-10-CM | POA: Diagnosis not present

## 2022-03-24 DIAGNOSIS — R278 Other lack of coordination: Secondary | ICD-10-CM | POA: Diagnosis not present

## 2022-03-24 DIAGNOSIS — M25632 Stiffness of left wrist, not elsewhere classified: Secondary | ICD-10-CM | POA: Diagnosis not present

## 2022-03-24 DIAGNOSIS — M25532 Pain in left wrist: Secondary | ICD-10-CM | POA: Diagnosis not present

## 2022-03-24 NOTE — Therapy (Addendum)
OUTPATIENT OCCUPATIONAL THERAPY TREATMENT & DISCHARGE NOTE   Patient Name: Stephen Schwartz MRN: [id number] DOB:[birth year], 51 y.o., male Today's Date: 03/24/2022  PCP: Dr. Claretta Fraise REFERRING PROVIDER: Dr. Sherilyn Cooter   Progress Note  Reporting Period 01/27/22 to 03/24/22.   See note below for Objective Data and Assessment of Progress/Goals.      END OF SESSION:   OT End of Session - 03/24/22 1022     Visit Number 5    Number of Visits 14    Date for OT Re-Evaluation 04/16/22    OT Start Time 1021    OT Stop Time 1057    OT Time Calculation (min) 36 min    Equipment Utilized During Treatment green putty and t-bands    Activity Tolerance Patient tolerated treatment well;No increased pain;Patient limited by fatigue    Behavior During Therapy [facility] for tasks assessed/performed                Past Medical History:  Diagnosis Date   Hip pain    History of nephrolithiasis    Hypertension    Psoriasis    Psoriasis    Past Surgical History:  Procedure Laterality Date   COLONOSCOPY WITH PROPOFOL N/A 02/09/2021   Procedure: COLONOSCOPY WITH PROPOFOL;  Surgeon: Eloise Harman, DO;  [facility];  Service: Endoscopy;  Laterality: N/A;  ASA II / 9:45   NO PAST SURGERIES     OPEN REDUCTION INTERNAL FIXATION (ORIF) DISTAL RADIAL FRACTURE Left 01/13/2022   Procedure: OPEN REDUCTION INTERNAL FIXATION (ORIF) DISTAL RADIAL FRACTURE;  Surgeon: Sherilyn Cooter, MD;  Location: [place];  Service: Orthopedics;  Laterality: Left;   POLYPECTOMY  02/09/2021   Procedure: POLYPECTOMY;  Surgeon: Eloise Harman, DO;  [facility];  Service: Endoscopy;;   Patient Active Problem List   Diagnosis Date Noted   Closed fracture of left distal radius 01/08/2022   Primary osteoarthritis of left hip 01/25/2020   Essential hypertension 03/31/2018   Encounter for Department of Transportation (DOT) examination for driving license renewal 33/43/5686   Psoriasis  12/08/2012    ONSET DATE: DOS 01/13/22   REFERRING DIAG: H68.372B (ICD-10-CM) - Closed Smith's fracture of left radius, initial encounter  THERAPY DIAG:  Pain in left wrist  Muscle weakness (generalized)  Stiffness of left wrist, not elsewhere classified  Other lack of coordination  Rationale for Evaluation and Treatment Rehabilitation  PERTINENT HISTORY: Per MD: "Left distal radius fracture s/p ORIF"   PRECAUTIONS: 10 weeks post op now, NWB in Left arm, A/A/PROM ok now, weighted stretches at 4 weeks, 5-6 weeks ok for hand strength as tol, 6-8 weeks for wrist strength, return to full activities by 10-12 weeks   WEIGHT BEARING RESTRICTIONS Yes NWB in left hand   SUBJECTIVE:  He states he was not in therapy in past few weeks but he continued home exercises. He has avoided heavy lifting and been somewhat cautious.    PAIN:  Are you having pain?  Yes, only aching  Rating: 3/10 at rest now    OBJECTIVE: (All objective assessments below are from initial evaluation on: 01/27/22 unless otherwise specified.)   HAND DOMINANCE: Right    ADLs: Overall ADLs: States decreased ability to grab, hold household objects, pain and inability to open containers, perform all FMS tasks, etc.      FUNCTIONAL OUTCOME MEASURES: 03/24/22: Patient Specific Functional Scale: 8.3  Eval: Patient Specific Functional Scale: 3.6 (tying shoes, open bottles/containers, use utensils)   UPPER  EXTREMITY ROM    01/27/22: about 30* flexion at all DIP Js now, other joints ~80-90*, looks pretty good. Doesn't have tight fist yet, can oppose to SF pad, but not to base of small finger.   02/02/22: full fist just somewhat loose still.  DIP J s are similar Lt to Rt    Active ROM Left eval Left 02/03/22 Left 02/09/22 Left 03/24/22  Wrist flexion 30 41 32 55  Wrist extension _0 59  Wrist pronation 75 75 82   Wrist supination 68 71 70   (Blank rows = not tested)     UPPER EXTREMITY MMT:   Eval: not  appropriate   MMT Left 03/24/22  Elbow flexion    Elbow extension    Wrist flexion 5/5   Wrist extension 4/5   Wrist ulnar deviation 4+/5   Wrist radial deviation 4-/5   Wrist pronation    Wrist supination    (Blank rows = not tested)   HAND FUNCTION: 03/24/22: Right: 97#;  Left: 56#   COORDINATION: 03/24/22: 9HPT Left: 23sec WFL   Eval: 9 Hole Peg Test Left: 32 sec (25 sec is WFL)    SENSATION: 03/24/22: slight, but better, numbness right at volar base of thumb  Eval: Light touch intact today, though diminished around sx area     EDEMA:             Eval:  No significant swelling in hand and wrist today, 21.8cm circumferentially around left MCP Js (22.4 in right hand)     OBSERVATIONS:            03/24/22: nothing TTP today   Eval: He is tender to PROM today, encouraged not to "push" too fast to limit swelling and complications     TODAY'S TREATMENT:  03/24/22: Pt performs AROM, gripping, coordination and strength with left arm/wrist/hand against resistance for exercise/activities as well as new measures today. Using that data, OT also reviews home exercises and provides appropriate upgrades as below. OT also discusses home and functional tasks with the pt and reviews goals, most of which were met and upgraded to reflect current status. Pt states understanding and tolerates upgrades well.     Exercises - Seated Wrist Flexion with Overpressure  - 4-6 x daily - 3-5 reps - 15 sec hold - Wrist Prayer Stretch  - 3-4 x daily - 3-5 reps - 15 sec hold - Full Fist  - 2-3 x daily - 5 reps - Finger Extension "Pizza!"   - 2-3 x daily - 5 reps - Thumb Opposition with Putty  - 2-3 x daily - 5 reps - Wrist Extension with Resistance  - 1-2 x daily - 1 sets - 10-15 reps - Wrist Flexion with Resistance  - 1-2 x daily - 1 sets - 10-15 reps - Wrist AROM Dart Throwers Motion with Resistance  - 2-3 x daily - 5-10 reps    PATIENT EDUCATION: Education details: See tx section above for details   Person educated: Patient Education method: Veterinary surgeon, Teach back, Handouts  Education comprehension: States and demonstrates understanding, Additional Education required      HOME EXERCISE PROGRAM: Access Code: [email or web address] URL: [email or web address]     GOALS: Goals reviewed with patient? Yes     SHORT TERM GOALS: (STG required if POC>30 days)   Pt will obtain protective, custom orthotic. Target date: 01/27/22 Goal status: MET   2.  Pt will demo/state understanding of initial HEP to improve pain levels and  prerequisite motion. Target date: 02/05/22 Goal status: 03/24/22: MET long ago     LONG TERM GOALS:   Pt will improve functional ability by decreased impairment per PSFS assessment from 3.6 to 8 or better, for better quality of life. Target date: 03/26/22 Goal status: 03/24/22: Met 8.3   2.  Pt will improve grip strength in left hand to at least 70 lbs for functional use at home and in IADLs. Target date: 03/26/22 Goal status: 03/24/22: MET 50# & UPGRADED TO 70# NOW    3.  Pt will improve A/ROM in left wrist flex & ext from ~30* each to at least 65* each, to have functional motion for tasks like reach and grasp.  Target date: 03/26/22 Goal status: 03/24/22: MET at least 50* and upgraded to 65* each now   4.  Pt will improve strength in left wrist flex & ext from 3-/5 MMT to at least 4+/5 MMT to have increased functional ability to carry out selfcare and higher-level homecare tasks with no difficulty. Target date: 03/26/22 Goal status: 03/24/22: progressing     ASSESSMENT:   CLINICAL IMPRESSION: 03/24/22: He met most goals but still has weakness compared to right side and c/o problems with heavier tasks (opening back door of home). We will need continued therapy for 2-3 weeks to meet newly upgraded goals.      PLAN: OT FREQUENCY: 1x/week   OT DURATION: up to 3 additional weeks (through 04/16/22)   PLANNED INTERVENTIONS: self care/ADL training, therapeutic  exercise, therapeutic activity, neuromuscular re-education, manual therapy, scar mobilization, passive range of motion, splinting, ultrasound, fluidotherapy, compression bandaging, moist heat, cryotherapy, contrast bath, patient/family education, coping strategies training, and DME and/or AE instructions   RECOMMENDED OTHER SERVICES: none now    CONSULTED AND AGREED WITH PLAN OF CARE: Patient   PLAN FOR NEXT SESSION:  Upgrade to whole arm PRE and activities as tolerated to mett higher IADL tasks. Review new strength/activities    Emmet Messer, OTR/L, CHT 03/24/2022, 11:26 AM    04/14/22:  OCCUPATIONAL THERAPY DISCHARGE SUMMARY  I called and spoke to him about need to cx today's appointment and not being seen for 3+ weeks. He states he's doing fairly well, happy with results and too busy to return to therapy.  We will D/C his POC now.   Visits from Start of Care: 5  Current functional level related to goals / functional outcomes: Pt has met most goals to satisfactory levels and is pleased with outcomes.   Remaining deficits: Pt has no more significant functional deficits or pain.   Education / Equipment: Pt has all needed materials and education. Pt understands how to continue on with self-management. See tx notes for more details.   Patient agrees to discharge due to max benefits received from outpatient occupational therapy / hand therapy at this time.   Benito Mccreedy, OTR/L, CHT 04/14/22

## 2022-03-31 ENCOUNTER — Encounter: Payer: BC Managed Care – PPO | Admitting: Rehabilitative and Restorative Service Providers"

## 2022-04-09 ENCOUNTER — Ambulatory Visit: Payer: Self-pay

## 2022-04-09 ENCOUNTER — Ambulatory Visit (INDEPENDENT_AMBULATORY_CARE_PROVIDER_SITE_OTHER): Payer: BC Managed Care – PPO | Admitting: Orthopedic Surgery

## 2022-04-09 ENCOUNTER — Encounter: Payer: Self-pay | Admitting: Orthopedic Surgery

## 2022-04-09 DIAGNOSIS — S52542A Smith's fracture of left radius, initial encounter for closed fracture: Secondary | ICD-10-CM

## 2022-04-09 NOTE — Progress Notes (Signed)
   Post-Op Visit Note   Patient: Stephen Schwartz           Date of Birth: 12/08/1970           MRN: [id number] Visit Date: 04/09/2022 PCP: Claretta Fraise, MD   Assessment & Plan:  Chief Complaint:  Chief Complaint  Patient presents with   Left Wrist - Routine Post Op   Visit Diagnoses:  1. Closed Smith's fracture of left radius, initial encounter     Plan: Patient is approximately 12 weeks s/p ORIF left distal radius.  He is doing very well.  X-rays show a healed fracture.  He has no pain and has full ROM w/ flexion/extension radial/ulnar deviation, and pronation/supination.  He has no complaints and is very happy with his progress.  He can see me again as needed.   Follow-Up Instructions: No follow-ups on file.   Orders:  Orders Placed This Encounter  Procedures   XR Wrist Complete Left   No orders of the defined types were placed in this encounter.   Imaging: No results found.  PMFS History: Patient Active Problem List   Diagnosis Date Noted   Closed fracture of left distal radius 01/08/2022   Primary osteoarthritis of left hip 01/25/2020   Essential hypertension 03/31/2018   Encounter for Department of Transportation (DOT) examination for driving license renewal [id number]/57/[id number]   Psoriasis 12/08/2012   Past Medical History:  Diagnosis Date   Hip pain    History of nephrolithiasis    Hypertension    Psoriasis    Psoriasis     Family History  Problem Relation Age of Onset   Colon cancer Paternal Grandfather     Past Surgical History:  Procedure Laterality Date   COLONOSCOPY WITH PROPOFOL N/A 02/09/2021   Procedure: COLONOSCOPY WITH PROPOFOL;  Surgeon: Eloise Harman, DO;  [facility];  Service: Endoscopy;  Laterality: N/A;  ASA II / 9:45   NO PAST SURGERIES     OPEN REDUCTION INTERNAL FIXATION (ORIF) DISTAL RADIAL FRACTURE Left 01/13/2022   Procedure: OPEN REDUCTION INTERNAL FIXATION (ORIF) DISTAL RADIAL FRACTURE;  Surgeon: Sherilyn Cooter, MD;   Location: [place];  Service: Orthopedics;  Laterality: Left;   POLYPECTOMY  02/09/2021   Procedure: POLYPECTOMY;  Surgeon: Eloise Harman, DO;  [facility];  Service: Endoscopy;;   Social History   Occupational History   Occupation: truck driver  Tobacco Use   Smoking status: Never   Smokeless tobacco: Never  Vaping Use   Vaping Use: Never used  Substance and Sexual Activity   Alcohol use: No   Drug use: No   Sexual activity: Not on file

## 2022-04-14 ENCOUNTER — Encounter: Payer: BC Managed Care – PPO | Admitting: Rehabilitative and Restorative Service Providers"

## 2022-07-12 ENCOUNTER — Ambulatory Visit (INDEPENDENT_AMBULATORY_CARE_PROVIDER_SITE_OTHER): Payer: BC Managed Care – PPO | Admitting: Family Medicine

## 2022-07-12 ENCOUNTER — Encounter: Payer: Self-pay | Admitting: Family Medicine

## 2022-07-12 VITALS — BP 161/105 | HR 78 | Temp 97.3°F | Ht 71.0 in | Wt 228.8 lb

## 2022-07-12 DIAGNOSIS — E785 Hyperlipidemia, unspecified: Secondary | ICD-10-CM | POA: Diagnosis not present

## 2022-07-12 DIAGNOSIS — I10 Essential (primary) hypertension: Secondary | ICD-10-CM

## 2022-07-12 LAB — CMP14+EGFR
ALT: 18 IU/L (ref 0–44)
AST: 21 IU/L (ref 0–40)
Albumin/Globulin Ratio: 1.8 (ref 1.2–2.2)
Albumin: 4.5 g/dL (ref 4.1–5.1)
Alkaline Phosphatase: 74 IU/L (ref 44–121)
BUN/Creatinine Ratio: 9 (ref 9–20)
BUN: 12 mg/dL (ref 6–24)
Bilirubin Total: 0.5 mg/dL (ref 0.0–1.2)
CO2: 22 mmol/L (ref 20–29)
Calcium: 9 mg/dL (ref 8.7–10.2)
Chloride: 104 mmol/L (ref 96–106)
Creatinine, Ser: 1.37 mg/dL — ABNORMAL HIGH (ref 0.76–1.27)
Globulin, Total: 2.5 g/dL (ref 1.5–4.5)
Glucose: 113 mg/dL — ABNORMAL HIGH (ref 70–99)
Potassium: 4.4 mmol/L (ref 3.5–5.2)
Sodium: 142 mmol/L (ref 134–144)
Total Protein: 7 g/dL (ref 6.0–8.5)
eGFR: 63 mL/min/{1.73_m2} (ref >59)

## 2022-07-12 LAB — CBC WITH DIFFERENTIAL/PLATELET
Basophils Absolute: 0.1 10*3/uL (ref 0.0–0.2)
Basos: 1 %
EOS (ABSOLUTE): 0.2 10*3/uL (ref 0.0–0.4)
Eos: 4 %
Hematocrit: 44.3 % (ref 37.5–51.0)
Hemoglobin: 14.6 g/dL (ref 13.0–17.7)
Immature Grans (Abs): 0 10*3/uL (ref 0.0–0.1)
Immature Granulocytes: 0 %
Lymphocytes Absolute: 1.9 10*3/uL (ref 0.7–3.1)
Lymphs: 40 %
MCH: 26.4 pg — ABNORMAL LOW (ref 26.6–33.0)
MCHC: 33 g/dL (ref 31.5–35.7)
MCV: 80 fL (ref 79–97)
Monocytes Absolute: 0.3 10*3/uL (ref 0.1–0.9)
Monocytes: 6 %
Neutrophils Absolute: 2.3 10*3/uL (ref 1.4–7.0)
Neutrophils: 49 %
Platelets: 266 10*3/uL (ref 150–450)
RBC: 5.53 x10E6/uL (ref 4.14–5.80)
RDW: 13.7 % (ref 11.6–15.4)
WBC: 4.7 10*3/uL (ref 3.4–10.8)

## 2022-07-12 LAB — LIPID PANEL
Chol/HDL Ratio: 3.3 ratio (ref 0.0–5.0)
Cholesterol, Total: 129 mg/dL (ref 100–199)
HDL: 39 mg/dL — ABNORMAL LOW (ref >39)
LDL Chol Calc (NIH): 74 mg/dL (ref 0–99)
Triglycerides: 81 mg/dL (ref 0–149)
VLDL Cholesterol Cal: 16 mg/dL (ref 5–40)

## 2022-07-12 MED ORDER — TROSPIUM CHLORIDE ER 60 MG PO CP24: 60.0000 mg | ORAL_CAPSULE | Freq: Every day | ORAL | 3 refills | Status: DC

## 2022-07-12 MED ORDER — LISINOPRIL 20 MG PO TABS: 20.0000 mg | ORAL_TABLET | Freq: Every day | ORAL | 3 refills | Status: DC

## 2022-07-12 MED ORDER — AMLODIPINE BESYLATE 10 MG PO TABS: 10.0000 mg | ORAL_TABLET | Freq: Every day | ORAL | 3 refills | Status: DC

## 2022-07-12 NOTE — Progress Notes (Signed)
Subjective:  Patient ID: Stephen Schwartz, male    DOB: [birth year]  Age: 51 y.o. MRN: [id number]  CC: Medical Management of Chronic Issues   HPI Stephen Schwartz presents for  follow-up of hypertension. Patient has no history of headache chest pain or shortness of breath or recent cough. Patient also denies symptoms of TIA such as focal numbness or weakness. Patient denies side effects from medication. Stopped his meds. Added exercise. Walking some, 3-4 times a week. Wanted to see if that would be enough. Had wrist surgery in May. Never got back on track.   History Stephen Schwartz has a past medical history of Hip pain, History of nephrolithiasis, Hypertension, Psoriasis, and Psoriasis.   He has a past surgical history that includes No past surgeries; Colonoscopy with propofol (N/A, 02/09/2021); polypectomy (02/09/2021); and Open reduction internal fixation (orif) distal radial fracture (Left, 01/13/2022).   His family history includes Colon cancer in his paternal grandfather.He reports that he has never smoked. He has never used smokeless tobacco. He reports that he does not drink alcohol and does not use drugs.  Current Outpatient Medications on File Prior to Visit  Medication Sig Dispense Refill   acetaminophen (TYLENOL) 500 MG tablet Take 1,000 mg by mouth every 6 (six) hours as needed for moderate pain.     fluticasone (FLONASE) 50 MCG/ACT nasal spray Place 2 sprays into both nostrils daily. (Patient taking differently: Place 2 sprays into both nostrils daily as needed for allergies.) 16 g 6   No current facility-administered medications on file prior to visit.    ROS Review of Systems  Constitutional:  Negative for fever.  Respiratory:  Negative for shortness of breath.   Cardiovascular:  Negative for chest pain.  Musculoskeletal:  Negative for arthralgias.  Skin:  Negative for rash.    Objective:  BP (!) 161/105   Pulse 78   Temp (!) 97.3 F (36.3 C)   Ht _0  (1.803 m)   Wt 228 lb  12.8 oz (103.8 kg)   SpO2 95%   BMI 31.91 kg/m   BP Readings from Last 3 Encounters:  07/12/22 (!) 161/105  01/13/22 (!) 135/92  01/06/22 139/89    Wt Readings from Last 3 Encounters:  07/12/22 228 lb 12.8 oz (103.8 kg)  01/13/22 212 lb (96.2 kg)  01/06/22 220 lb 9.6 oz (100.1 kg)     Physical Exam Vitals reviewed.  Constitutional:      Appearance: He is well-developed.  HENT:     Head: Normocephalic and atraumatic.     Right Ear: External ear normal.     Left Ear: External ear normal.     Mouth/Throat:     Pharynx: No oropharyngeal exudate or posterior oropharyngeal erythema.  Eyes:     Pupils: Pupils are equal, round, and reactive to light.  Cardiovascular:     Rate and Rhythm: Normal rate and regular rhythm.     Heart sounds: No murmur heard. Pulmonary:     Effort: No respiratory distress.     Breath sounds: Normal breath sounds.  Musculoskeletal:     Cervical back: Normal range of motion and neck supple.  Neurological:     Mental Status: He is alert and oriented to person, place, and time.       Assessment & Plan:   Stephen Schwartz was seen today for medical management of chronic issues.  Diagnoses and all orders for this visit:  Hyperlipidemia, unspecified hyperlipidemia type -     Lipid panel  Essential hypertension -     CBC with Differential/Platelet -     CMP14+EGFR -     lisinopril (ZESTRIL) 20 MG tablet; Take 1 tablet (20 mg total) by mouth daily.  Other orders -     amLODipine (NORVASC) 10 MG tablet; Take 1 tablet (10 mg total) by mouth daily. For blood pressure -     Trospium Chloride 60 MG CP24; Take 1 capsule (60 mg total) by mouth daily.   Allergies as of 07/12/2022       Reactions   Myrbetriq [mirabegron] Other (See Comments)   Shoulder pain        Medication List        Accurate as of July 12, 2022  9:48 AM. If you have any questions, ask your nurse or doctor.          acetaminophen 500 MG tablet Commonly known as:  TYLENOL Take 1,000 mg by mouth every 6 (six) hours as needed for moderate pain.   amLODipine 10 MG tablet Commonly known as: NORVASC Take 1 tablet (10 mg total) by mouth daily. For blood pressure What changed: when to take this   fluticasone 50 MCG/ACT nasal spray Commonly known as: FLONASE Place 2 sprays into both nostrils daily. What changed:  when to take this reasons to take this   lisinopril 20 MG tablet Commonly known as: ZESTRIL Take 1 tablet (20 mg total) by mouth daily. What changed: when to take this   Trospium Chloride 60 MG Cp24 Take 1 capsule (60 mg total) by mouth daily.        Meds ordered this encounter  Medications   amLODipine (NORVASC) 10 MG tablet    Sig: Take 1 tablet (10 mg total) by mouth daily. For blood pressure    Dispense:  90 tablet    Refill:  3    Please cancel scrip for amlodipine 5 mg daily   lisinopril (ZESTRIL) 20 MG tablet    Sig: Take 1 tablet (20 mg total) by mouth daily.    Dispense:  90 tablet    Refill:  3   Trospium Chloride 60 MG CP24    Sig: Take 1 capsule (60 mg total) by mouth daily.    Dispense:  90 capsule    Refill:  3      Follow-up: Return in about 6 weeks (around 08/23/2022) for hypertension.  Claretta Fraise, M.D.

## 2022-07-13 NOTE — Progress Notes (Signed)
Hello Jafari,  Your lab result is normal and/or stable.Some minor variations that are not significant are commonly marked abnormal, but do not represent any medical problem for you.  Best regards, Bharat Antillon, M.D.

## 2022-08-25 ENCOUNTER — Ambulatory Visit: Payer: BC Managed Care – PPO | Admitting: Family Medicine

## 2022-09-29 ENCOUNTER — Ambulatory Visit: Payer: BC Managed Care – PPO | Admitting: Family Medicine

## 2022-09-29 ENCOUNTER — Encounter: Payer: Self-pay | Admitting: Family Medicine

## 2022-09-29 VITALS — BP 126/84 | HR 79 | Temp 97.9°F | Ht 71.0 in | Wt 230.2 lb

## 2022-09-29 DIAGNOSIS — E785 Hyperlipidemia, unspecified: Secondary | ICD-10-CM | POA: Diagnosis not present

## 2022-09-29 DIAGNOSIS — I10 Essential (primary) hypertension: Secondary | ICD-10-CM | POA: Diagnosis not present

## 2022-09-29 DIAGNOSIS — E669 Obesity, unspecified: Secondary | ICD-10-CM | POA: Diagnosis not present

## 2022-09-29 NOTE — Progress Notes (Signed)
Subjective:  Patient ID: Stephen Schwartz, male    DOB: 1970/12/15  Age: 51 y.o. MRN: [id number]  CC: Medical Management of Chronic Issues   HPI Stephen Schwartz presents for  follow-up of hypertension. Patient has no history of headache chest pain or shortness of breath or recent cough. Patient also denies symptoms of TIA such as focal numbness or weakness. Patient denies side effects from medication. States taking it regularly.  Concerned about weight.  Needs to lose. Eating more chicken. Eating biscuits, sweets.    History Stephen Schwartz has a past medical history of Hip pain, History of nephrolithiasis, Hypertension, Psoriasis, and Psoriasis.   Stephen Schwartz has a past surgical history that includes No past surgeries; Colonoscopy with propofol (N/A, 02/09/2021); polypectomy (02/09/2021); and Open reduction internal fixation (orif) distal radial fracture (Left, 01/13/2022).   His family history includes Colon cancer in his paternal grandfather.Stephen Schwartz reports that Stephen Schwartz has never smoked. Stephen Schwartz has never used smokeless tobacco. Stephen Schwartz reports that Stephen Schwartz does not drink alcohol and does not use drugs.  Current Outpatient Medications on File Prior to Visit  Medication Sig Dispense Refill   acetaminophen (TYLENOL) 500 MG tablet Take 1,000 mg by mouth every 6 (six) hours as needed for moderate pain.     amLODipine (NORVASC) 10 MG tablet Take 1 tablet (10 mg total) by mouth daily. For blood pressure 90 tablet 3   fluticasone (FLONASE) 50 MCG/ACT nasal spray Place 2 sprays into both nostrils daily. (Patient taking differently: Place 2 sprays into both nostrils daily as needed for allergies.) 16 g 6   lisinopril (ZESTRIL) 20 MG tablet Take 1 tablet (20 mg total) by mouth daily. 90 tablet 3   Trospium Chloride 60 MG CP24 Take 1 capsule (60 mg total) by mouth daily. 90 capsule 3   No current facility-administered medications on file prior to visit.    ROS Review of Systems  Constitutional:  Negative for fever.  Respiratory:  Negative  for shortness of breath.   Cardiovascular:  Negative for chest pain.  Musculoskeletal:  Negative for arthralgias.  Skin:  Negative for rash.    Objective:  BP 126/84   Pulse 79   Temp 97.9 F (36.6 C)   Ht 5' 11"$  (1.803 m)   Wt 230 lb 3.2 oz (104.4 kg)   SpO2 97%   BMI 32.11 kg/m   BP Readings from Last 3 Encounters:  09/29/22 126/84  07/12/22 (!) 161/105  01/13/22 (!) 135/92    Wt Readings from Last 3 Encounters:  09/29/22 230 lb 3.2 oz (104.4 kg)  07/12/22 228 lb 12.8 oz (103.8 kg)  01/13/22 212 lb (96.2 kg)     Physical Exam Vitals reviewed.  Constitutional:      Appearance: Stephen Schwartz is well-developed.  HENT:     Head: Normocephalic and atraumatic.     Right Ear: External ear normal.     Left Ear: External ear normal.     Mouth/Throat:     Pharynx: No oropharyngeal exudate or posterior oropharyngeal erythema.  Eyes:     Pupils: Pupils are equal, round, and reactive to light.  Cardiovascular:     Rate and Rhythm: Normal rate and regular rhythm.     Heart sounds: No murmur heard. Pulmonary:     Effort: No respiratory distress.     Breath sounds: Normal breath sounds.  Musculoskeletal:     Cervical back: Normal range of motion and neck supple.  Neurological:     Mental Status: Stephen Schwartz is alert and  oriented to person, place, and time.       Assessment & Plan:   Stephen Schwartz was seen today for medical management of chronic issues.  Diagnoses and all orders for this visit:  Hyperlipidemia, unspecified hyperlipidemia type -     Cancel: Lipid panel  Essential hypertension -     Cancel: CBC with Differential/Platelet -     Cancel: CMP14+EGFR  Obesity (BMI 30.0-34.9) -     Amb Ref to Medical Weight Management   Allergies as of 09/29/2022       Reactions   Myrbetriq [mirabegron] Other (See Comments)   Shoulder pain        Medication List        Accurate as of September 29, 2022  8:55 AM. If you have any questions, ask your nurse or doctor.           acetaminophen 500 MG tablet Commonly known as: TYLENOL Take 1,000 mg by mouth every 6 (six) hours as needed for moderate pain.   amLODipine 10 MG tablet Commonly known as: NORVASC Take 1 tablet (10 mg total) by mouth daily. For blood pressure   fluticasone 50 MCG/ACT nasal spray Commonly known as: FLONASE Place 2 sprays into both nostrils daily. What changed:  when to take this reasons to take this   lisinopril 20 MG tablet Commonly known as: ZESTRIL Take 1 tablet (20 mg total) by mouth daily.   Trospium Chloride 60 MG Cp24 Take 1 capsule (60 mg total) by mouth daily.            Follow-up: Return in about 6 months (around 03/30/2023).  Claretta Fraise, M.D.

## 2023-03-17 ENCOUNTER — Encounter (INDEPENDENT_AMBULATORY_CARE_PROVIDER_SITE_OTHER): Payer: Self-pay

## 2023-03-30 ENCOUNTER — Ambulatory Visit: Payer: BC Managed Care – PPO | Admitting: Family Medicine

## 2023-04-04 ENCOUNTER — Ambulatory Visit: Payer: BC Managed Care – PPO | Admitting: Family Medicine

## 2023-04-27 ENCOUNTER — Ambulatory Visit: Payer: BC Managed Care – PPO | Admitting: Family Medicine

## 2023-04-27 ENCOUNTER — Encounter: Payer: Self-pay | Admitting: Family Medicine

## 2023-04-27 VITALS — BP 151/95 | HR 62 | Temp 98.2°F | Ht 71.0 in | Wt 229.2 lb

## 2023-04-27 DIAGNOSIS — R002 Palpitations: Secondary | ICD-10-CM

## 2023-04-27 DIAGNOSIS — R42 Dizziness and giddiness: Secondary | ICD-10-CM | POA: Diagnosis not present

## 2023-04-27 DIAGNOSIS — R5381 Other malaise: Secondary | ICD-10-CM | POA: Diagnosis not present

## 2023-04-27 DIAGNOSIS — R5383 Other fatigue: Secondary | ICD-10-CM | POA: Diagnosis not present

## 2023-04-27 LAB — BAYER DCA HB A1C WAIVED: HB A1C (BAYER DCA - WAIVED): 5.9 % — ABNORMAL HIGH (ref 4.8–5.6)

## 2023-04-27 NOTE — Progress Notes (Signed)
Subjective:  Patient ID: Stephen Schwartz, male    DOB: 09/25/1970, 52 y.o.   MRN: [id number]  Patient Care Team: Mechele Claude, MD as PCP - General (Family Medicine) Lanelle Bal, DO as Consulting Physician (Gastroenterology)   Chief Complaint:  Dizziness (Patient states he got dizzy & cold chills yesterday morning but fine now. States it lasted a few hours. )   HPI: Stephen Schwartz is a 52 y.o. male presenting on 04/27/2023 for Dizziness (Patient states he got dizzy & cold chills yesterday morning but fine now. States it lasted a few hours. )   Pt presents today for evaluation. States yesterday while driving his truck he had a dizzy spell that lasted for about 2 hours. He had not had anything to eat when this happened and had been on the road driving for about 3 hours. States he got dizzy, had palpitations and chills. He pulled over and took a nap. When he woke up, he got something to eat and then he felt better but still had slight dizziness. This lasted for a few more minutes and then subsided. States he feels fine today.      Relevant past medical, surgical, family, and social history reviewed and updated as indicated.  Allergies and medications reviewed and updated. Data reviewed: Chart in Epic.   Past Medical History:  Diagnosis Date   Hip pain    History of nephrolithiasis    Hypertension    Psoriasis    Psoriasis     Past Surgical History:  Procedure Laterality Date   COLONOSCOPY WITH PROPOFOL N/A 02/09/2021   Procedure: COLONOSCOPY WITH PROPOFOL;  Surgeon: Lanelle Bal, DO;  Location: [facility];  Service: Endoscopy;  Laterality: N/A;  ASA II / 9:45   NO PAST SURGERIES     OPEN REDUCTION INTERNAL FIXATION (ORIF) DISTAL RADIAL FRACTURE Left 01/13/2022   Procedure: OPEN REDUCTION INTERNAL FIXATION (ORIF) DISTAL RADIAL FRACTURE;  Surgeon: Marlyne Beards, MD;  Location: [facility];  Service: Orthopedics;  Laterality: Left;   POLYPECTOMY  02/09/2021   Procedure:  POLYPECTOMY;  Surgeon: Lanelle Bal, DO;  Location: [facility];  Service: Endoscopy;;    Social History   Socioeconomic History   Marital status: Married    Spouse name: Not on file   Number of children: Not on file   Years of education: Not on file   Highest education level: Not on file  Occupational History   Occupation: truck driver  Tobacco Use   Smoking status: Never   Smokeless tobacco: Never  Vaping Use   Vaping status: Never Used  Substance and Sexual Activity   Alcohol use: No   Drug use: No   Sexual activity: Not on file  Other Topics Concern   Not on file  Social History Narrative   Not on file   Social Determinants of Health   Financial Resource Strain: Not on file  Food Insecurity: Not on file  Transportation Needs: Not on file  Physical Activity: Not on file  Stress: Not on file  Social Connections: Not on file  Intimate Partner Violence: Not on file    Outpatient Encounter Medications as of 04/27/2023  Medication Sig   acetaminophen (TYLENOL) 500 MG tablet Take 1,000 mg by mouth every 6 (six) hours as needed for moderate pain.   amLODipine (NORVASC) 10 MG tablet Take 1 tablet (10 mg total) by mouth daily. For blood pressure   fluticasone (FLONASE) 50 MCG/ACT nasal spray  Place 2 sprays into both nostrils daily. (Patient taking differently: Place 2 sprays into both nostrils daily as needed for allergies.)   lisinopril (ZESTRIL) 20 MG tablet Take 1 tablet (20 mg total) by mouth daily.   Trospium Chloride 60 MG CP24 Take 1 capsule (60 mg total) by mouth daily.   No facility-administered encounter medications on file as of 04/27/2023.    Allergies  Allergen Reactions   Myrbetriq [Mirabegron] Other (See Comments)    Shoulder pain     Review of Systems  Constitutional:  Positive for chills and fatigue. Negative for activity change, appetite change, diaphoresis, fever and unexpected weight change.  HENT: Negative.    Eyes: Negative.  Negative  for photophobia and visual disturbance.  Respiratory:  Negative for cough, chest tightness and shortness of breath.   Cardiovascular:  Positive for palpitations. Negative for chest pain and leg swelling.  Gastrointestinal:  Negative for abdominal pain, blood in stool, constipation, diarrhea, nausea and vomiting.  Endocrine: Negative.  Negative for polydipsia, polyphagia and polyuria.  Genitourinary:  Negative for decreased urine volume, difficulty urinating, dysuria, frequency and urgency.  Musculoskeletal:  Negative for arthralgias and myalgias.  Skin: Negative.   Allergic/Immunologic: Negative.   Neurological:  Positive for dizziness. Negative for tremors, seizures, syncope, facial asymmetry, speech difficulty, weakness, light-headedness, numbness and headaches.  Hematological: Negative.   Psychiatric/Behavioral:  Negative for confusion, hallucinations, sleep disturbance and suicidal ideas.   All other systems reviewed and are negative.       Objective:  BP (!) 151/95   Pulse 62   Temp 98.2 F (36.8 C) (Temporal)   Ht 5\' 11"  (1.803 m)   Wt 229 lb 3.2 oz (104 kg)   SpO2 99%   BMI 31.97 kg/m    Wt Readings from Last 3 Encounters:  04/27/23 229 lb 3.2 oz (104 kg)  09/29/22 230 lb 3.2 oz (104.4 kg)  07/12/22 228 lb 12.8 oz (103.8 kg)    Physical Exam Vitals and nursing note reviewed.  Constitutional:      General: He is not in acute distress.    Appearance: Normal appearance. He is well-developed and well-groomed. He is obese. He is not ill-appearing, toxic-appearing or diaphoretic.  HENT:     Head: Normocephalic and atraumatic.     Jaw: There is normal jaw occlusion.     Right Ear: Hearing normal.     Left Ear: Hearing normal.     Nose: Nose normal.     Mouth/Throat:     Lips: Pink.     Mouth: Mucous membranes are moist.     Pharynx: Oropharynx is clear. Uvula midline.  Eyes:     General: Lids are normal.     Extraocular Movements: Extraocular movements intact.      Conjunctiva/sclera: Conjunctivae normal.     Pupils: Pupils are equal, round, and reactive to light.  Neck:     Thyroid: No thyroid mass, thyromegaly or thyroid tenderness.     Vascular: No carotid bruit or JVD.     Trachea: Trachea and phonation normal.  Cardiovascular:     Rate and Rhythm: Regular rhythm. Bradycardia present.     Chest Wall: PMI is not displaced.     Pulses: Normal pulses.     Heart sounds: Normal heart sounds. No murmur heard.    No friction rub. No gallop.  Pulmonary:     Effort: Pulmonary effort is normal. No respiratory distress.     Breath sounds: Normal breath sounds. No wheezing.  Abdominal:     General: Bowel sounds are normal. There is no distension or abdominal bruit.     Palpations: Abdomen is soft. There is no hepatomegaly or splenomegaly.     Tenderness: There is no abdominal tenderness. There is no right CVA tenderness or left CVA tenderness.     Hernia: No hernia is present.  Musculoskeletal:        General: Normal range of motion.     Cervical back: Normal range of motion and neck supple.     Right lower leg: No edema.     Left lower leg: No edema.  Lymphadenopathy:     Cervical: No cervical adenopathy.  Skin:    General: Skin is warm and dry.     Capillary Refill: Capillary refill takes less than 2 seconds.     Coloration: Skin is not cyanotic, jaundiced or pale.     Findings: No rash.  Neurological:     General: No focal deficit present.     Mental Status: He is alert and oriented to person, place, and time.     Sensory: Sensation is intact.     Motor: Motor function is intact.     Coordination: Coordination is intact.     Gait: Gait is intact.     Deep Tendon Reflexes: Reflexes are normal and symmetric.  Psychiatric:        Attention and Perception: Attention and perception normal.        Mood and Affect: Mood and affect normal.        Speech: Speech normal.        Behavior: Behavior normal. Behavior is cooperative.        Thought  Content: Thought content normal.        Cognition and Memory: Cognition and memory normal.        Judgment: Judgment normal.     Results for orders placed or performed in visit on 04/27/23  CBC with Differential/Platelet  Result Value Ref Range   WBC 4.2 3.4 - 10.8 x10E3/uL   RBC 5.18 4.14 - 5.80 x10E6/uL   Hemoglobin 13.6 13.0 - 17.7 g/dL   Hematocrit 16.1 09.6 - 51.0 %   MCV 83 79 - 97 fL   MCH 26.3 (L) 26.6 - 33.0 pg   MCHC 31.7 31.5 - 35.7 g/dL   RDW 04.5 40.9 - 81.1 %   Platelets 267 150 - 450 x10E3/uL   Neutrophils 47 Not Estab. %   Lymphs 44 Not Estab. %   Monocytes 6 Not Estab. %   Eos 2 Not Estab. %   Basos 1 Not Estab. %   Neutrophils Absolute 2.0 1.4 - 7.0 x10E3/uL   Lymphocytes Absolute 1.9 0.7 - 3.1 x10E3/uL   Monocytes Absolute 0.3 0.1 - 0.9 x10E3/uL   EOS (ABSOLUTE) 0.1 0.0 - 0.4 x10E3/uL   Basophils Absolute 0.0 0.0 - 0.2 x10E3/uL   Immature Granulocytes 0 Not Estab. %   Immature Grans (Abs) 0.0 0.0 - 0.1 x10E3/uL  CMP14+EGFR  Result Value Ref Range   Glucose WILL FOLLOW    BUN WILL FOLLOW    Creatinine, Ser WILL FOLLOW    eGFR WILL FOLLOW    BUN/Creatinine Ratio WILL FOLLOW    Sodium WILL FOLLOW    Potassium WILL FOLLOW    Chloride WILL FOLLOW    CO2 WILL FOLLOW    Calcium WILL FOLLOW    Total Protein WILL FOLLOW    Albumin WILL FOLLOW    Globulin, Total  WILL FOLLOW    Bilirubin Total WILL FOLLOW    Alkaline Phosphatase WILL FOLLOW    AST WILL FOLLOW    ALT WILL FOLLOW   Thyroid Panel With TSH  Result Value Ref Range   TSH WILL FOLLOW    T4, Total WILL FOLLOW    T3 Uptake Ratio WILL FOLLOW    Free Thyroxine Index WILL FOLLOW   Bayer DCA Hb A1c Waived  Result Value Ref Range   HB A1C (BAYER DCA - WAIVED) 5.9 (H) 4.8 - 5.6 %     EKG: SB 51, PR 162 ms, QT 436 ms, no ectopy or acute ST-T changes. Incomplete BBB. No change from prior EKG. Kari Baars, FNP-C.   Pertinent labs & imaging results that were available during my care of the patient  were reviewed by me and considered in my medical decision making.  Assessment & Plan:  Yaphet was seen today for dizziness.  Diagnoses and all orders for this visit:  Light headedness Malaise and fatigue Palpitations Symptoms have resolved. EKG with SB, otherwise unremarkable. Likely related to low blood sugar as this resolved once pt had something to eat and rest. Will check below labs for potential underlying causes. Pt aware of red flags concerning for CVA,  ACS or hypoglycemia that require emergent evaluation and treatment.  -     CBC with Differential/Platelet -     CMP14+EGFR -     Thyroid Panel With TSH -     Bayer DCA Hb A1c Waived -     EKG 12-Lead     Continue all other maintenance medications.  Follow up plan: Return in 2 weeks (on 05/11/2023), or if symptoms worsen or fail to improve.   Continue healthy lifestyle choices, including diet (rich in fruits, vegetables, and lean proteins, and low in salt and simple carbohydrates) and exercise (at least 30 minutes of moderate physical activity daily).   The above assessment and management plan was discussed with the patient. The patient verbalized understanding of and has agreed to the management plan. Patient is aware to call the clinic if they develop any new symptoms or if symptoms persist or worsen. Patient is aware when to return to the clinic for a follow-up visit. Patient educated on when it is appropriate to go to the emergency department.   Kari Baars, FNP-C [facility] [phone]

## 2023-04-28 LAB — CBC WITH DIFFERENTIAL/PLATELET
Basophils Absolute: 0 10*3/uL (ref 0.0–0.2)
Basos: 1 %
EOS (ABSOLUTE): 0.1 10*3/uL (ref 0.0–0.4)
Eos: 2 %
Hematocrit: 42.9 % (ref 37.5–51.0)
Hemoglobin: 13.6 g/dL (ref 13.0–17.7)
Immature Grans (Abs): 0 10*3/uL (ref 0.0–0.1)
Immature Granulocytes: 0 %
Lymphocytes Absolute: 1.9 10*3/uL (ref 0.7–3.1)
Lymphs: 44 %
MCH: 26.3 pg — ABNORMAL LOW (ref 26.6–33.0)
MCHC: 31.7 g/dL (ref 31.5–35.7)
MCV: 83 fL (ref 79–97)
Monocytes Absolute: 0.3 10*3/uL (ref 0.1–0.9)
Monocytes: 6 %
Neutrophils Absolute: 2 10*3/uL (ref 1.4–7.0)
Neutrophils: 47 %
Platelets: 267 10*3/uL (ref 150–450)
RBC: 5.18 x10E6/uL (ref 4.14–5.80)
RDW: 14.3 % (ref 11.6–15.4)
WBC: 4.2 10*3/uL (ref 3.4–10.8)

## 2023-04-28 LAB — THYROID PANEL WITH TSH
Free Thyroxine Index: 1.7 (ref 1.2–4.9)
T3 Uptake Ratio: 27 % (ref 24–39)
T4, Total: 6.3 ug/dL (ref 4.5–12.0)
TSH: 2.04 u[IU]/mL (ref 0.450–4.500)

## 2023-04-28 LAB — CMP14+EGFR
ALT: 18 IU/L (ref 0–44)
AST: 16 IU/L (ref 0–40)
Albumin: 4.4 g/dL (ref 3.8–4.9)
Alkaline Phosphatase: 71 IU/L (ref 44–121)
BUN/Creatinine Ratio: 8 — ABNORMAL LOW (ref 9–20)
BUN: 11 mg/dL (ref 6–24)
Bilirubin Total: 1 mg/dL (ref 0.0–1.2)
CO2: 21 mmol/L (ref 20–29)
Calcium: 8.9 mg/dL (ref 8.7–10.2)
Chloride: 104 mmol/L (ref 96–106)
Creatinine, Ser: 1.37 mg/dL — ABNORMAL HIGH (ref 0.76–1.27)
Globulin, Total: 2.3 g/dL (ref 1.5–4.5)
Glucose: 112 mg/dL — ABNORMAL HIGH (ref 70–99)
Potassium: 4.5 mmol/L (ref 3.5–5.2)
Sodium: 141 mmol/L (ref 134–144)
Total Protein: 6.7 g/dL (ref 6.0–8.5)
eGFR: 62 mL/min/{1.73_m2} (ref >59)

## 2023-05-03 ENCOUNTER — Telehealth: Payer: Self-pay

## 2023-05-03 NOTE — Telephone Encounter (Signed)
Lmtcb with most recent blood pressure reading

## 2023-05-25 ENCOUNTER — Ambulatory Visit: Payer: BC Managed Care – PPO | Admitting: Family Medicine

## 2023-06-01 ENCOUNTER — Ambulatory Visit: Payer: BC Managed Care – PPO | Admitting: Family Medicine

## 2023-06-01 ENCOUNTER — Encounter: Payer: Self-pay | Admitting: Family Medicine

## 2023-06-01 VITALS — BP 140/97 | HR 67 | Temp 97.3°F | Ht 71.0 in | Wt 217.8 lb

## 2023-06-01 DIAGNOSIS — E785 Hyperlipidemia, unspecified: Secondary | ICD-10-CM | POA: Diagnosis not present

## 2023-06-01 DIAGNOSIS — I10 Essential (primary) hypertension: Secondary | ICD-10-CM

## 2023-06-01 DIAGNOSIS — Z125 Encounter for screening for malignant neoplasm of prostate: Secondary | ICD-10-CM

## 2023-06-01 DIAGNOSIS — R7303 Prediabetes: Secondary | ICD-10-CM

## 2023-06-01 LAB — BAYER DCA HB A1C WAIVED: HB A1C (BAYER DCA - WAIVED): 5.7 % — ABNORMAL HIGH (ref 4.8–5.6)

## 2023-06-01 MED ORDER — LISINOPRIL 40 MG PO TABS
40.0000 mg | ORAL_TABLET | Freq: Every day | ORAL | 1 refills | Status: DC
Start: 2023-06-01 — End: 2023-11-29

## 2023-06-01 MED ORDER — LISINOPRIL 20 MG PO TABS
20.0000 mg | ORAL_TABLET | Freq: Every day | ORAL | 3 refills | Status: DC
Start: 2023-06-01 — End: 2023-06-01

## 2023-06-01 MED ORDER — TROSPIUM CHLORIDE ER 60 MG PO CP24: 60.0000 mg | ORAL_CAPSULE | Freq: Every day | ORAL | 3 refills | Status: DC

## 2023-06-01 NOTE — Progress Notes (Signed)
Subjective:  Patient ID: Stephen Schwartz,  male    DOB: 10-30-1970  Age: 52 y.o.    CC: Medical Management of Chronic Issues   HPI Stephen Schwartz presents for  follow-up of hypertension. Patient has no history of headache chest pain or shortness of breath or recent cough. Patient also denies symptoms of TIA such as numbness weakness lateralizing. Patient denies side effects from medication. States taking it regularly.  Patient also  in for follow-up of elevated cholesterol. Doing well without complaints on current medication. Denies side effects  including myalgia and arthralgia and nausea. Also in today for liver function testing. Currently no chest pain, shortness of breath or other cardiovascular related symptoms noted.  Follow-up of prediabetes. Patient does not check blood sugar at home. Patient denies symptoms such as excessive hunger or urinary frequency, excessive hunger, nausea No significant hypoglycemic spells noted. Medications reviewed. Pt reports taking them regularly. Pt. denies complication/adverse reaction today.    History Stephen Schwartz has a past medical history of Hip pain, History of nephrolithiasis, Hypertension, Psoriasis, and Psoriasis.   Stephen Schwartz has a past surgical history that includes No past surgeries; Colonoscopy with propofol (N/A, 02/09/2021); polypectomy (02/09/2021); and Open reduction internal fixation (orif) distal radial fracture (Left, 01/13/2022).   His family history includes Colon cancer in his paternal grandfather.Stephen Schwartz reports that Stephen Schwartz has never smoked. Stephen Schwartz has never used smokeless tobacco. Stephen Schwartz reports that Stephen Schwartz does not drink alcohol and does not use drugs.  Current Outpatient Medications on File Prior to Visit  Medication Sig Dispense Refill   acetaminophen (TYLENOL) 500 MG tablet Take 1,000 mg by mouth every 6 (six) hours as needed for moderate pain.     fluticasone (FLONASE) 50 MCG/ACT nasal spray Place 2 sprays into both nostrils daily. (Patient taking differently:  Place 2 sprays into both nostrils daily as needed for allergies.) 16 g 6   No current facility-administered medications on file prior to visit.    ROS Review of Systems  Constitutional:  Negative for fever.  Respiratory:  Negative for shortness of breath.   Cardiovascular:  Negative for chest pain.  Musculoskeletal:  Negative for arthralgias.  Skin:  Negative for rash.    Objective:  BP (!) 140/97   Pulse 67   Temp (!) 97.3 F (36.3 C)   Ht 5\' 11"  (1.803 m)   Wt 217 lb 12.8 oz (98.8 kg)   SpO2 98%   BMI 30.38 kg/m   BP Readings from Last 3 Encounters:  06/01/23 (!) 140/97  04/27/23 (!) 151/95  09/29/22 126/84    Wt Readings from Last 3 Encounters:  06/01/23 217 lb 12.8 oz (98.8 kg)  04/27/23 229 lb 3.2 oz (104 kg)  09/29/22 230 lb 3.2 oz (104.4 kg)     Physical Exam Vitals reviewed.  Constitutional:      Appearance: Stephen Schwartz is well-developed.  HENT:     Head: Normocephalic and atraumatic.     Right Ear: External ear normal.     Left Ear: External ear normal.     Mouth/Throat:     Pharynx: No oropharyngeal exudate or posterior oropharyngeal erythema.  Eyes:     Pupils: Pupils are equal, round, and reactive to light.  Cardiovascular:     Rate and Rhythm: Normal rate and regular rhythm.     Heart sounds: No murmur heard. Pulmonary:     Effort: No respiratory distress.     Breath sounds: Normal breath sounds.  Musculoskeletal:     Cervical back:  Normal range of motion and neck supple.  Neurological:     Mental Status: Stephen Schwartz is alert and oriented to person, place, and time.     Diabetic Foot Exam - Simple   No data filed     Lab Results  Component Value Date   HGBA1C 5.7 (H) 06/01/2023   HGBA1C 5.9 (H) 04/27/2023    Assessment & Plan:   Stephen Schwartz was seen today for medical management of chronic issues.  Diagnoses and all orders for this visit:  Hyperlipidemia, unspecified hyperlipidemia type -     Lipid panel  Essential hypertension -     CBC with  Differential/Platelet -     CMP14+EGFR -     Discontinue: lisinopril (ZESTRIL) 20 MG tablet; Take 1 tablet (20 mg total) by mouth daily. -     lisinopril (ZESTRIL) 40 MG tablet; Take 1 tablet (40 mg total) by mouth daily.  Prediabetes -     Bayer DCA Hb A1c Waived  Prostate cancer screening -     PSA, total and free  Other orders -     Trospium Chloride 60 MG CP24; Take 1 capsule (60 mg total) by mouth daily.   I have discontinued Fayrene Fearing T. Speranza's amLODipine and lisinopril. I have also changed his lisinopril. Additionally, I am having him maintain his fluticasone, acetaminophen, and Trospium Chloride.  Meds ordered this encounter  Medications   DISCONTD: lisinopril (ZESTRIL) 20 MG tablet    Sig: Take 1 tablet (20 mg total) by mouth daily.    Dispense:  90 tablet    Refill:  3   Trospium Chloride 60 MG CP24    Sig: Take 1 capsule (60 mg total) by mouth daily.    Dispense:  90 capsule    Refill:  3   lisinopril (ZESTRIL) 40 MG tablet    Sig: Take 1 tablet (40 mg total) by mouth daily.    Dispense:  90 tablet    Refill:  1     Follow-up: Return in about 6 months (around 11/30/2023).  Mechele Claude, M.D.

## 2023-06-02 LAB — CBC WITH DIFFERENTIAL/PLATELET
Basophils Absolute: 0 10*3/uL (ref 0.0–0.2)
Basos: 1 %
EOS (ABSOLUTE): 0.1 10*3/uL (ref 0.0–0.4)
Eos: 3 %
Hematocrit: 44.5 % (ref 37.5–51.0)
Hemoglobin: 14.5 g/dL (ref 13.0–17.7)
Immature Grans (Abs): 0 10*3/uL (ref 0.0–0.1)
Immature Granulocytes: 0 %
Lymphocytes Absolute: 1.5 10*3/uL (ref 0.7–3.1)
Lymphs: 44 %
MCH: 26.5 pg — ABNORMAL LOW (ref 26.6–33.0)
MCHC: 32.6 g/dL (ref 31.5–35.7)
MCV: 81 fL (ref 79–97)
Monocytes Absolute: 0.2 10*3/uL (ref 0.1–0.9)
Monocytes: 7 %
Neutrophils Absolute: 1.5 10*3/uL (ref 1.4–7.0)
Neutrophils: 45 %
Platelets: 255 10*3/uL (ref 150–450)
RBC: 5.48 x10E6/uL (ref 4.14–5.80)
RDW: 14.8 % (ref 11.6–15.4)
WBC: 3.4 10*3/uL (ref 3.4–10.8)

## 2023-06-02 LAB — CMP14+EGFR
ALT: 17 IU/L (ref 0–44)
AST: 19 IU/L (ref 0–40)
Albumin: 4.6 g/dL (ref 3.8–4.9)
Alkaline Phosphatase: 74 IU/L (ref 44–121)
BUN/Creatinine Ratio: 7 — ABNORMAL LOW (ref 9–20)
BUN: 11 mg/dL (ref 6–24)
Bilirubin Total: 2.2 mg/dL — ABNORMAL HIGH (ref 0.0–1.2)
CO2: 23 mmol/L (ref 20–29)
Calcium: 9.3 mg/dL (ref 8.7–10.2)
Chloride: 105 mmol/L (ref 96–106)
Creatinine, Ser: 1.55 mg/dL — ABNORMAL HIGH (ref 0.76–1.27)
Globulin, Total: 2.3 g/dL (ref 1.5–4.5)
Glucose: 100 mg/dL — ABNORMAL HIGH (ref 70–99)
Potassium: 4.1 mmol/L (ref 3.5–5.2)
Sodium: 142 mmol/L (ref 134–144)
Total Protein: 6.9 g/dL (ref 6.0–8.5)
eGFR: 54 mL/min/{1.73_m2} — ABNORMAL LOW (ref >59)

## 2023-06-02 LAB — PSA, TOTAL AND FREE
PSA, Free Pct: 34.1 %
PSA, Free: 0.99 ng/mL
Prostate Specific Ag, Serum: 2.9 ng/mL (ref 0.0–4.0)

## 2023-06-02 LAB — LIPID PANEL
Cholesterol, Total: 110 mg/dL (ref 100–199)
HDL: 35 mg/dL — ABNORMAL LOW (ref >39)
LDL CALC COMMENT:: 3.1 ratio (ref 0.0–5.0)
LDL Chol Calc (NIH): 60 mg/dL (ref 0–99)
Triglycerides: 73 mg/dL (ref 0–149)
VLDL Cholesterol Cal: 15 mg/dL (ref 5–40)

## 2023-06-05 ENCOUNTER — Encounter: Payer: Self-pay | Admitting: Family Medicine

## 2023-06-05 NOTE — Progress Notes (Signed)
Hello Stephen Schwartz,  Your lab result is normal and/or stable.Some minor variations that are not significant are commonly marked abnormal, but do not represent any medical problem for you.  Best regards, Dontrae Morini, M.D.

## 2023-06-12 IMAGING — DX DG HAND COMPLETE 3+V*L*
3 series · 3 of 3 positions shown · non-contrast
Comparison: None Available.

CLINICAL DATA: No evidence of fracture of the carpal or metacarpal
bones. Radiocarpal joint is intact. Phalanges are normal. No soft
tissue injury.

EXAM:
LEFT HAND - COMPLETE 3+ VIEW

[hand pa]
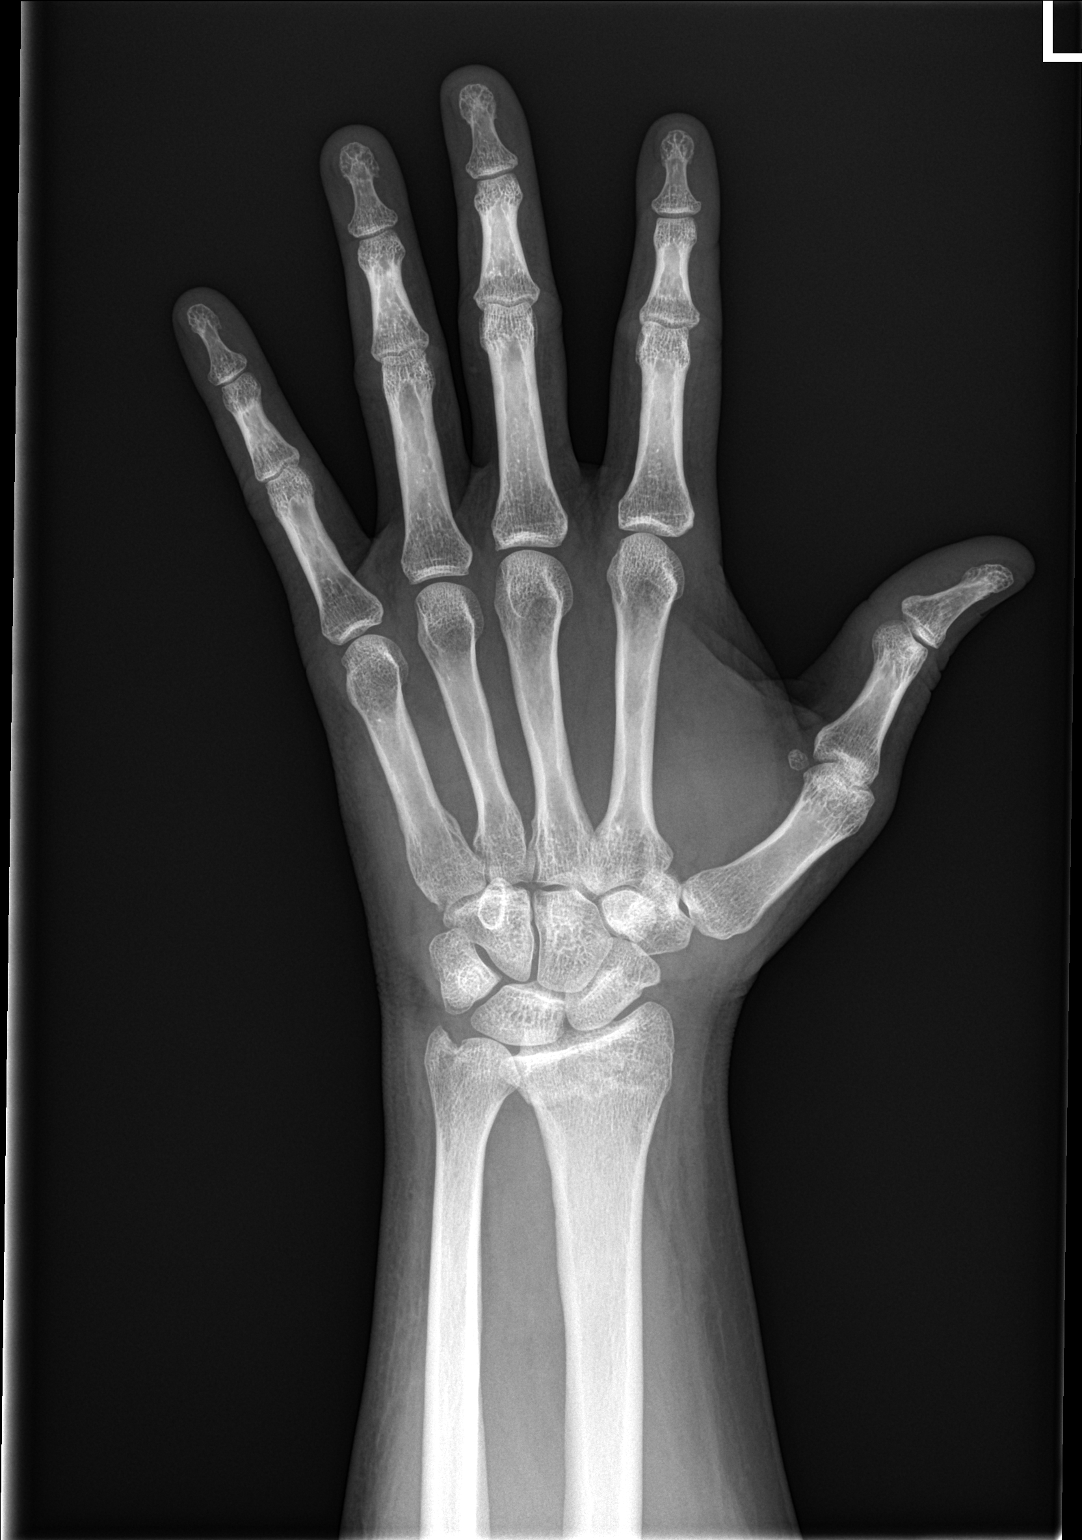

[hand obl]
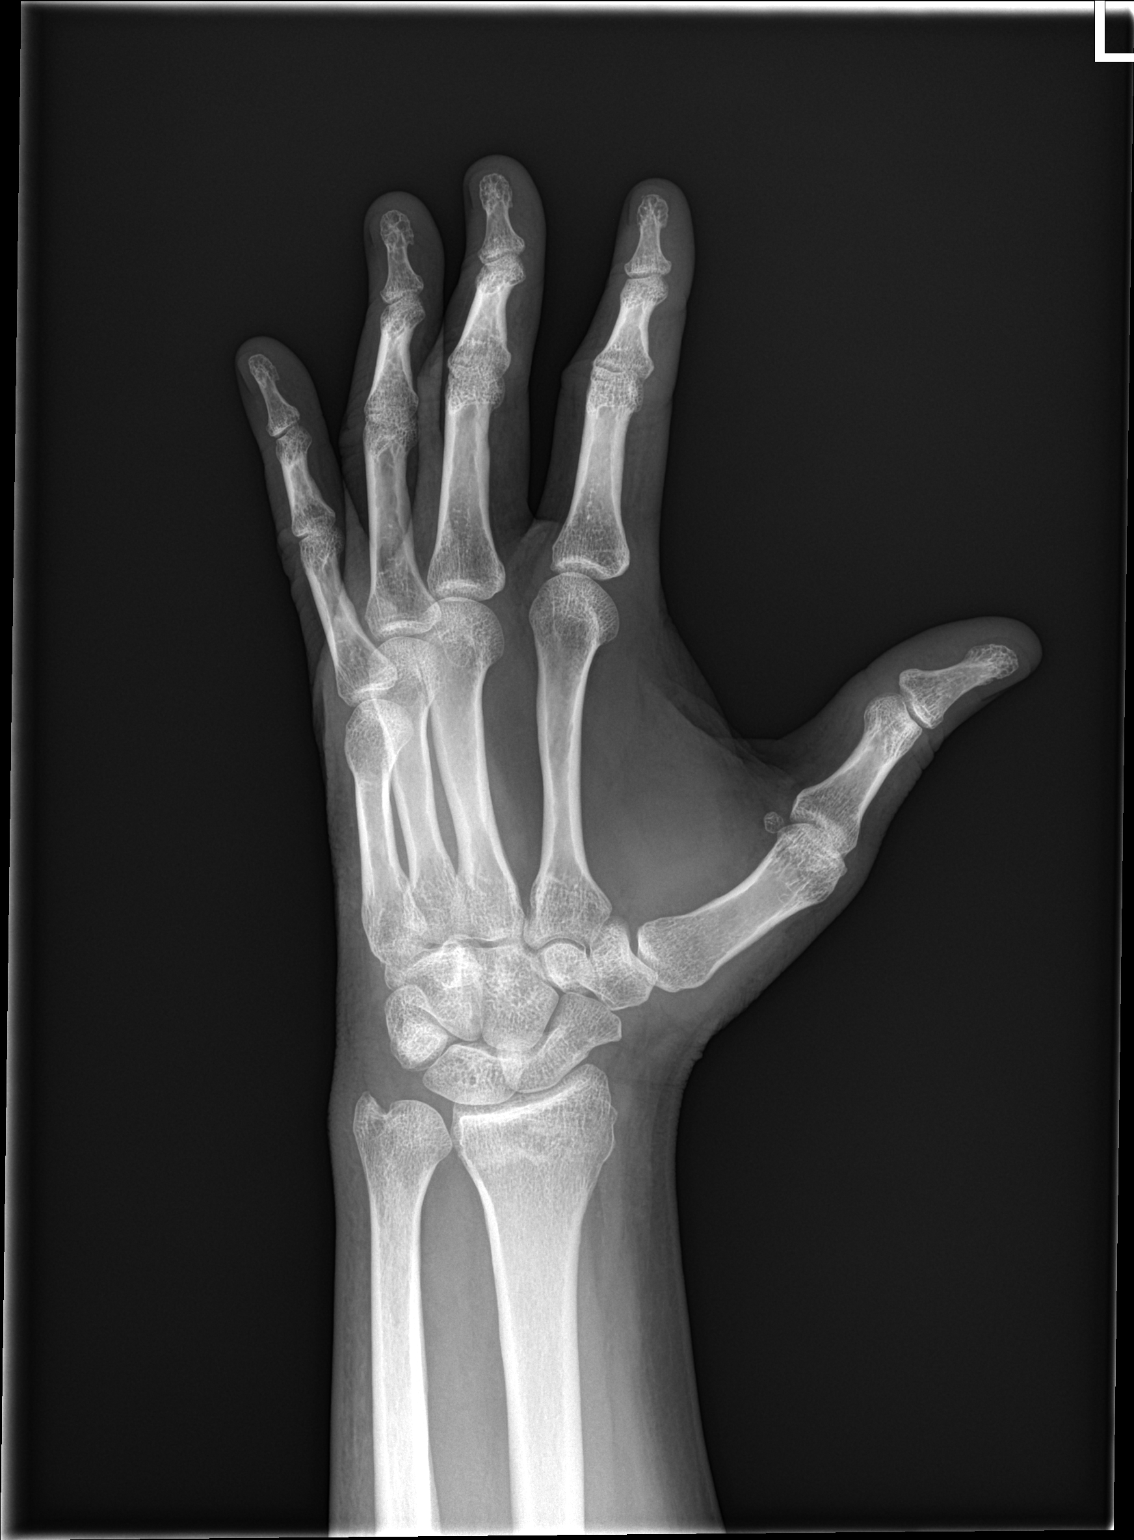

[hand lat]
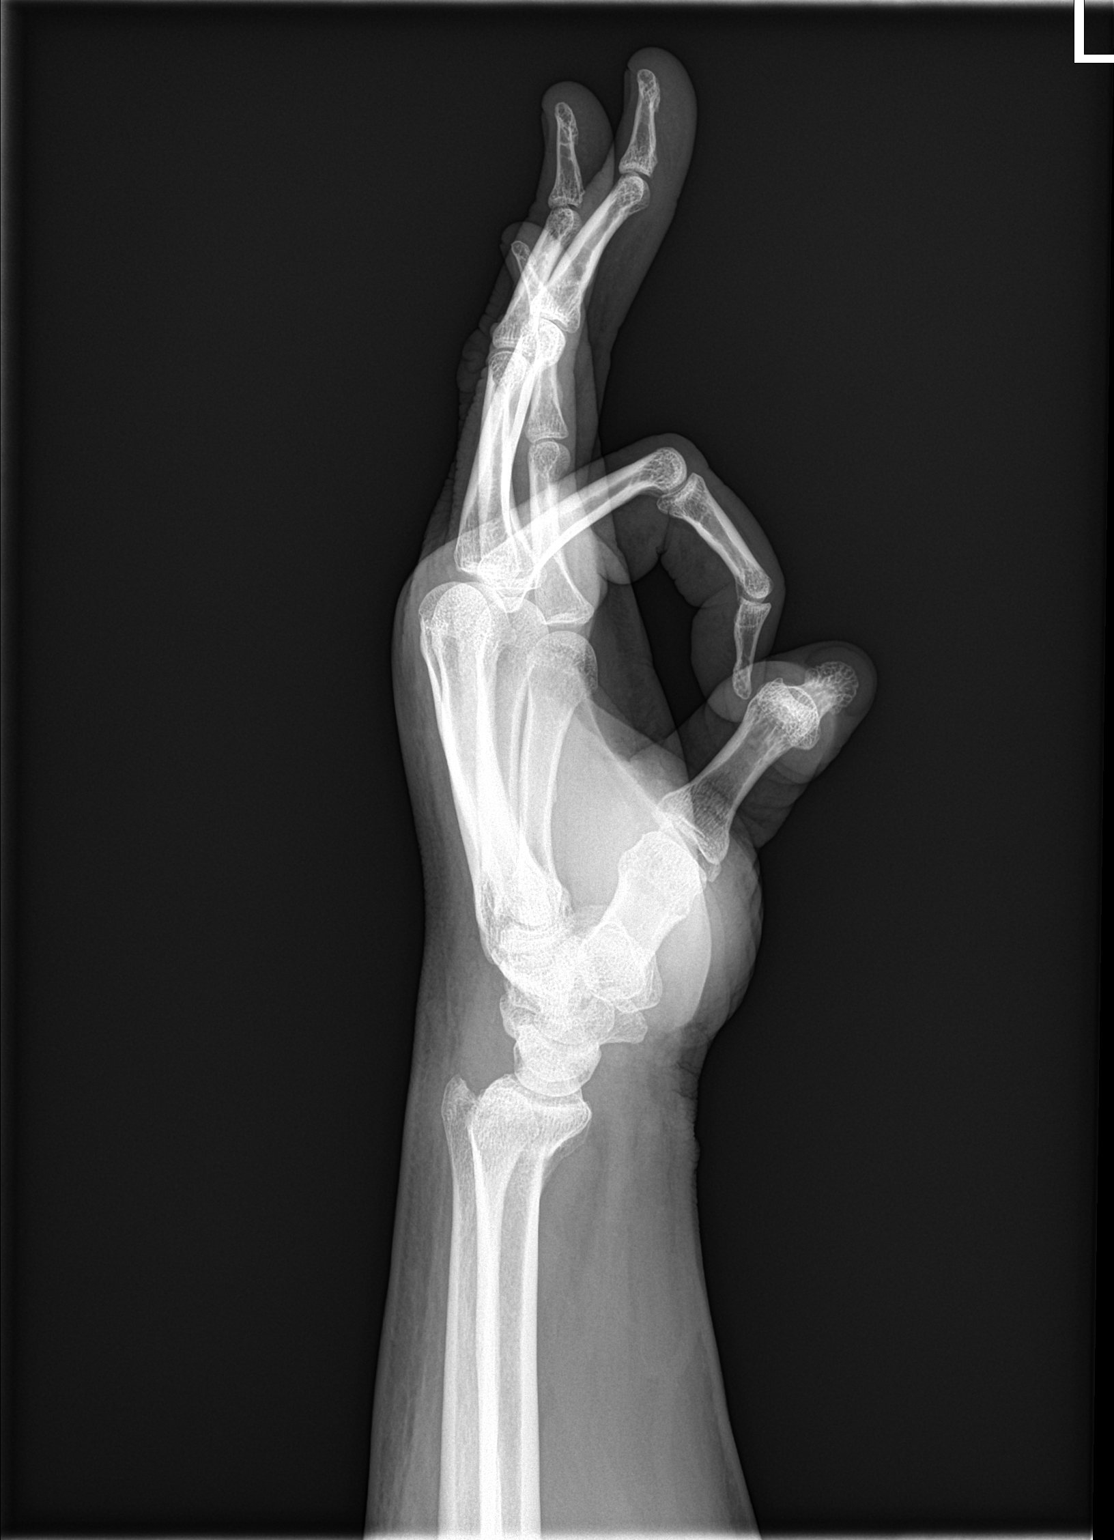

[3 of 3 positions shown; findings below may reference images not displayed]

FINDINGS: No evidence of fracture of the carpal or metacarpal bones.
Radiocarpal joint is intact. Phalanges are normal. No soft tissue
injury.
IMPRESSION: No fracture or dislocation.

## 2023-06-12 IMAGING — DX DG WRIST COMPLETE 3+V*L*
3 series · 3 of 3 positions shown · non-contrast
Comparison: None Available.

CLINICAL DATA: pain after falling

EXAM:
LEFT WRIST - COMPLETE 3+ VIEW

[wrist ap]
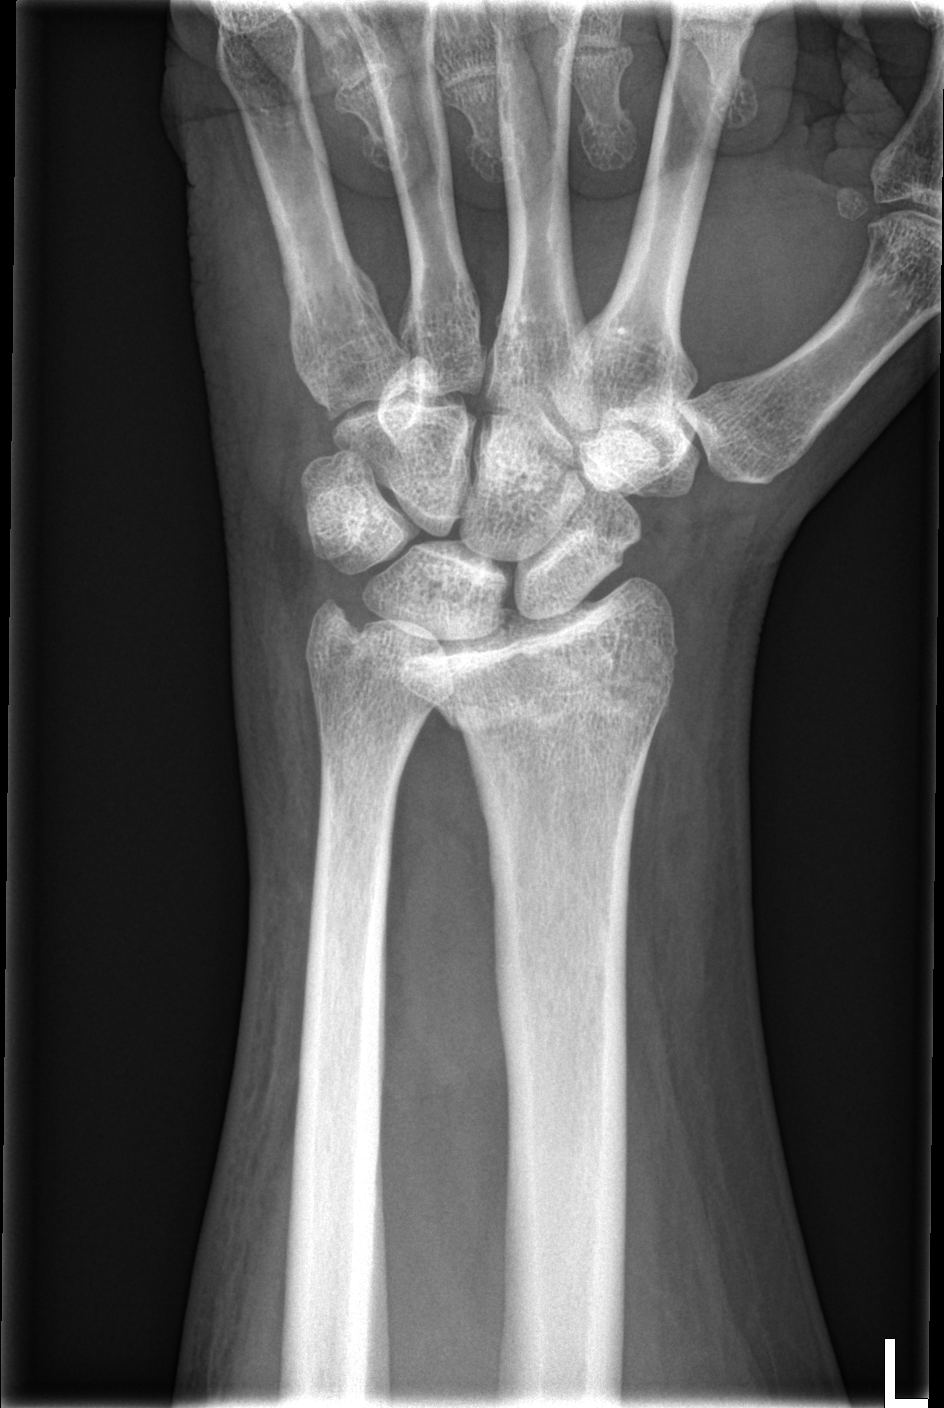

[wrist lat]
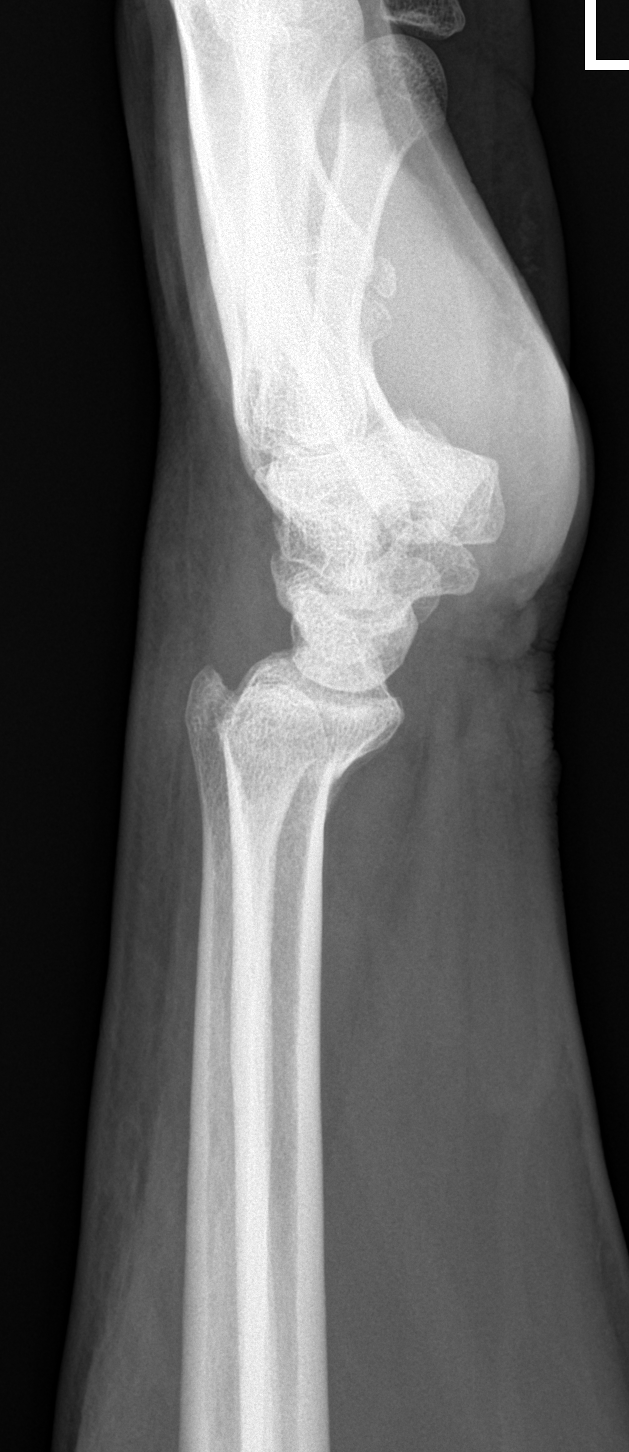

[wrist obl]
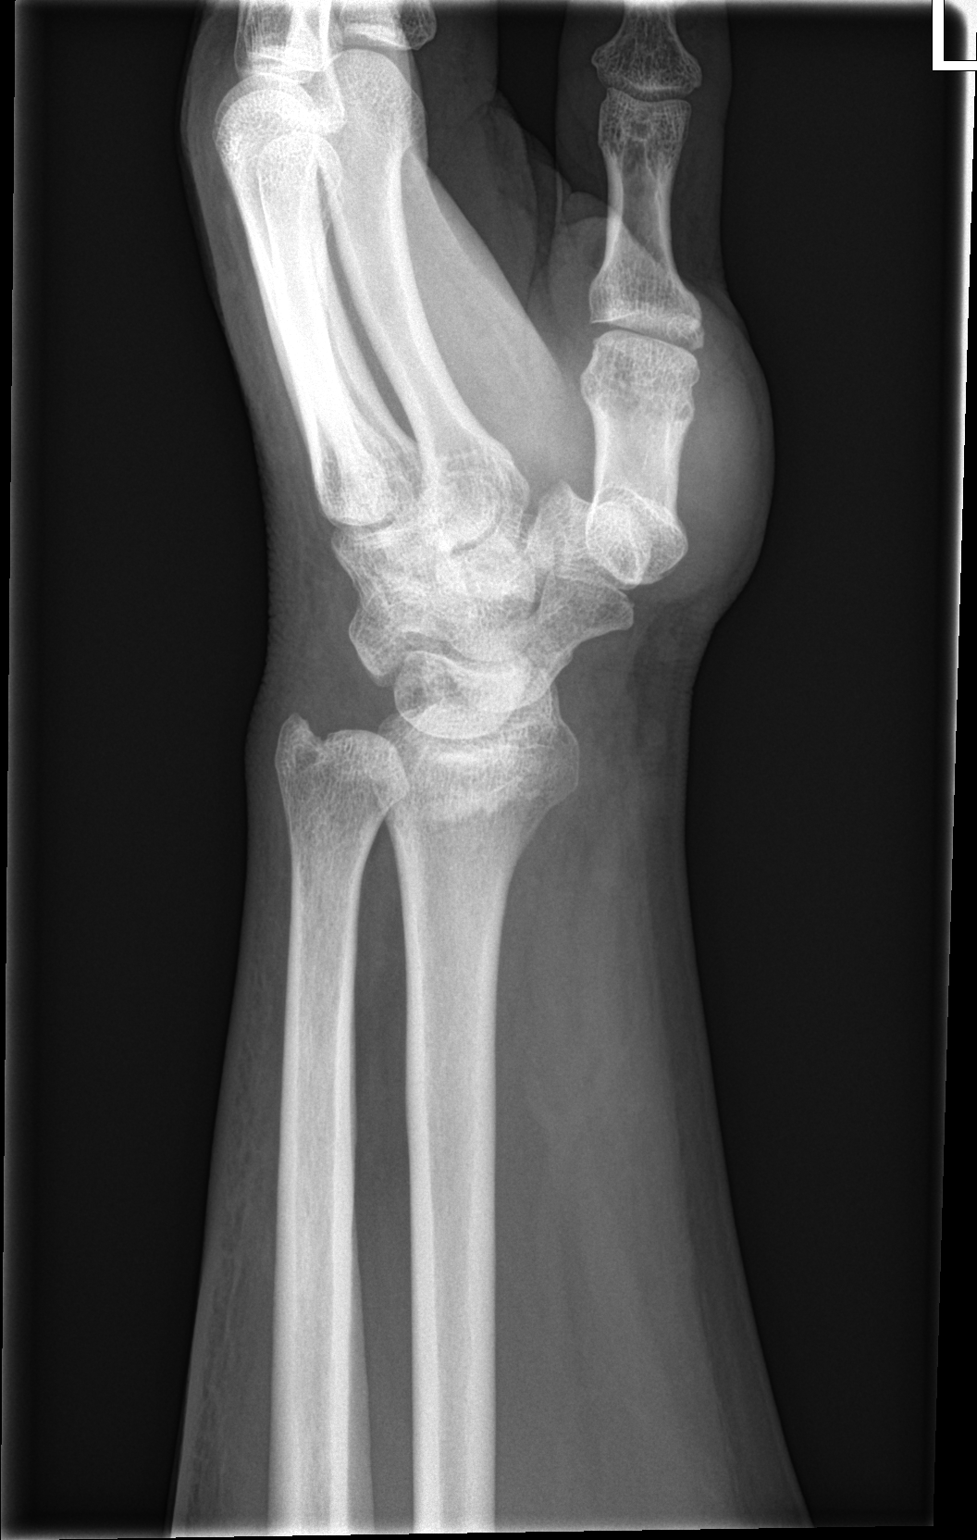

[3 of 3 positions shown; findings below may reference images not displayed]

FINDINGS: There is an impacted, minimally displaced distal radial metaphyseal
fracture with volar angulation. Adjacent soft tissue swelling.
IMPRESSION: Impacted, minimally displaced distal radial metaphyseal fracture
with volar angulation.

## 2023-09-19 ENCOUNTER — Telehealth: Payer: Self-pay | Admitting: Pharmacy Technician

## 2023-09-19 ENCOUNTER — Other Ambulatory Visit (HOSPITAL_COMMUNITY): Payer: Self-pay

## 2023-09-19 NOTE — Telephone Encounter (Signed)
Pharmacy Patient Advocate Encounter   Received notification from CoverMyMeds that prior authorization for Trospium Chloride ER 60MG  er capsules is required/requested.   Insurance verification completed.   The patient is insured through [facility] .   Per test claim: PA required; PA submitted to above mentioned insurance via CoverMyMeds Key/confirmation #/EOC [id number] Status is pending

## 2023-09-19 NOTE — Telephone Encounter (Signed)
Pharmacy Patient Advocate Encounter  Received notification from [facility] that Prior Authorization for Trospium Chloride ER 60MG  er capsules  has been DENIED.  Full denial letter will be uploaded to the media tab. See denial reason below.     PA #/Case ID/Reference #: [id number]

## 2023-09-20 NOTE — Telephone Encounter (Signed)
Pt notified and states he will reach out. LS

## 2023-09-20 NOTE — Telephone Encounter (Signed)
I recommend he see his Urologist Dr. Ronne Binning. He should call Dr. Macario Carls office for an appointment. Not likely to need a referral, but if he does let me know and I will write it.

## 2023-11-29 ENCOUNTER — Emergency Department (HOSPITAL_COMMUNITY)
Admission: EM | Admit: 2023-11-29 | Discharge: 2023-11-29 | Disposition: A | Discharge status: Home / Self Care | Attending: Emergency Medicine | Admitting: Emergency Medicine

## 2023-11-29 ENCOUNTER — Emergency Department (HOSPITAL_COMMUNITY)

## 2023-11-29 ENCOUNTER — Encounter (HOSPITAL_COMMUNITY): Payer: Self-pay

## 2023-11-29 ENCOUNTER — Ambulatory Visit: Payer: BC Managed Care – PPO | Admitting: Family Medicine

## 2023-11-29 ENCOUNTER — Other Ambulatory Visit: Payer: Self-pay

## 2023-11-29 DIAGNOSIS — R0602 Shortness of breath: Secondary | ICD-10-CM | POA: Diagnosis not present

## 2023-11-29 DIAGNOSIS — I10 Essential (primary) hypertension: Secondary | ICD-10-CM | POA: Insufficient documentation

## 2023-11-29 DIAGNOSIS — R002 Palpitations: Secondary | ICD-10-CM

## 2023-11-29 DIAGNOSIS — Z79899 Other long term (current) drug therapy: Secondary | ICD-10-CM | POA: Insufficient documentation

## 2023-11-29 LAB — CBC WITH DIFFERENTIAL/PLATELET
Abs Immature Granulocytes: 0 10*3/uL (ref 0.00–0.07)
Basophils Absolute: 0 10*3/uL (ref 0.0–0.1)
Basophils Relative: 1 %
Eosinophils Absolute: 0.1 10*3/uL (ref 0.0–0.5)
Eosinophils Relative: 3 %
HCT: 42.8 % (ref 39.0–52.0)
Hemoglobin: 14 g/dL (ref 13.0–17.0)
Immature Granulocytes: 0 %
Lymphocytes Relative: 33 %
Lymphs Abs: 1.6 10*3/uL (ref 0.7–4.0)
MCH: 26.3 pg (ref 26.0–34.0)
MCHC: 32.7 g/dL (ref 30.0–36.0)
MCV: 80.3 fL (ref 80.0–100.0)
Monocytes Absolute: 0.3 10*3/uL (ref 0.1–1.0)
Monocytes Relative: 7 %
Neutro Abs: 2.6 10*3/uL (ref 1.7–7.7)
Neutrophils Relative %: 56 %
Platelets: 246 10*3/uL (ref 150–400)
RBC: 5.33 MIL/uL (ref 4.22–5.81)
RDW: 15 % (ref 11.5–15.5)
WBC: 4.7 10*3/uL (ref 4.0–10.5)
nRBC: 0 % (ref 0.0–0.2)

## 2023-11-29 LAB — BASIC METABOLIC PANEL WITH GFR
Anion gap: 8 (ref 5–15)
BUN: 13 mg/dL (ref 6–20)
CO2: 25 mmol/L (ref 22–32)
Calcium: 8.5 mg/dL — ABNORMAL LOW (ref 8.9–10.3)
Chloride: 103 mmol/L (ref 98–111)
Creatinine, Ser: 1.13 mg/dL (ref 0.61–1.24)
GFR, Estimated: >60 mL/min (ref >60)
Glucose, Bld: 90 mg/dL (ref 70–99)
Potassium: 3.9 mmol/L (ref 3.5–5.1)
Sodium: 136 mmol/L (ref 135–145)

## 2023-11-29 LAB — BRAIN NATRIURETIC PEPTIDE: B Natriuretic Peptide: 16 pg/mL (ref 0.0–100.0)

## 2023-11-29 MED ORDER — LISINOPRIL 20 MG PO TABS: 20.0000 mg | ORAL_TABLET | Freq: Every day | ORAL | 0 refills | Status: DC

## 2023-11-29 NOTE — Discharge Instructions (Signed)
 You were seen in the emergency department for elevated heart rate and shortness of breath after an increase in your blood pressure medication.  Your blood pressure is elevated here.  You had blood work EKG chest x-ray that did not show any significant abnormalities.  Will start you back on your lisinopril 20 mg and please reach out to your primary care doctor for close follow-up.  They may need to try another kind of blood pressure medicine if the higher dose of the lisinopril is causing you troubles.

## 2023-11-29 NOTE — ED Provider Notes (Signed)
 [place] EMERGENCY DEPARTMENT AT [facility] Provider Note   CSN: [id number] Arrival date & time: 11/29/23  1842     History {Add pertinent medical, surgical, social history, OB history to HPI:1} Chief Complaint  Patient presents with   Hypertension    Stephen Schwartz is a 53 y.o. male.  He has a history of hypertension.  His doctor recently increased his lisinopril from 20 mg to 40 mg.  After he took the new dose he experienced rapid heart rate and feeling short of breath.  He went back to the 20 mg on his own until he ran out.  Restarted the 40 mg and had similar symptoms today.  Went to urgent care who referred him here for further evaluation.  He currently endorses no complaints, no palpitations chest pain or shortness of breath.  No headache or dizziness.  Has an appointment with his PCP next week for further discussion.  He said he is also tried amlodipine in the past but that caused him peripheral edema.  The history is provided by the patient.  Palpitations Palpitations quality:  Fast Onset quality:  Sudden Timing:  Sporadic Progression:  Resolved Chronicity:  New Relieved by:  Nothing Exacerbated by: ? medication change. Associated symptoms: shortness of breath   Associated symptoms: no chest pain, no chest pressure, no cough, no diaphoresis, no nausea and no vomiting        Home Medications Prior to Admission medications   Medication Sig Start Date End Date Taking? Authorizing Provider  acetaminophen (TYLENOL) 500 MG tablet Take 1,000 mg by mouth every 6 (six) hours as needed for moderate pain.    [provider]  fluticasone (FLONASE) 50 MCG/ACT nasal spray Place 2 sprays into both nostrils daily. Patient taking differently: Place 2 sprays into both nostrils daily as needed for allergies. 01/04/19   Tommas Fragmin A, FNP  lisinopril (ZESTRIL) 40 MG tablet Take 1 tablet (40 mg total) by mouth daily. Patient taking differently: Take 20 mg by mouth  daily. 06/01/23   Roise Cleaver, MD  Trospium Chloride 60 MG CP24 Take 1 capsule (60 mg total) by mouth daily. 06/01/23   Roise Cleaver, MD      Allergies    Myrbetriq [mirabegron]    Review of Systems   Review of Systems  Constitutional:  Negative for diaphoresis.  Respiratory:  Positive for shortness of breath. Negative for cough.   Cardiovascular:  Positive for palpitations. Negative for chest pain.  Gastrointestinal:  Negative for nausea and vomiting.    Physical Exam Updated Vital Signs BP (!) 182/102 (BP Location: Right Arm)   Pulse (!) 52   Temp 98.5 F (36.9 C) (Oral)   Resp 17   Ht 5\' 11"  (1.803 m)   Wt 98.9 kg   SpO2 100%   BMI 30.41 kg/m  Physical Exam Vitals and nursing note reviewed.  Constitutional:      General: He is not in acute distress.    Appearance: Normal appearance. He is well-developed.  HENT:     Head: Normocephalic and atraumatic.  Eyes:     Conjunctiva/sclera: Conjunctivae normal.  Cardiovascular:     Rate and Rhythm: Normal rate and regular rhythm.     Heart sounds: No murmur heard. Pulmonary:     Effort: Pulmonary effort is normal. No respiratory distress.     Breath sounds: Normal breath sounds.  Abdominal:     Palpations: Abdomen is soft.     Tenderness: There is no abdominal  tenderness. There is no guarding or rebound.  Musculoskeletal:        General: No swelling.     Cervical back: Neck supple.  Skin:    General: Skin is warm and dry.     Capillary Refill: Capillary refill takes less than 2 seconds.  Neurological:     General: No focal deficit present.     Mental Status: He is alert and oriented to person, place, and time.     Sensory: No sensory deficit.     Motor: No weakness.     ED Results / Procedures / Treatments   Labs (all labs ordered are listed, but only abnormal results are displayed) Labs Reviewed - No data to display  EKG EKG Interpretation Date/Time:  Tuesday November 29 2023 18:52:00 EDT Ventricular  Rate:  55 PR Interval:  158 QRS Duration:  76 QT Interval:  400 QTC Calculation: 382 R Axis:   23  Text Interpretation: Sinus bradycardia with sinus arrhythmia Possible Inferior infarct , age undetermined Abnormal ECG When compared with ECG of 15-Jul-2020 06:50, No significant change since last tracing Confirmed by Racheal Buddle [phone]) on 11/29/2023 6:55:00 PM  Radiology No results found.  Procedures Procedures  {Document cardiac monitor, telemetry assessment procedure when appropriate:1}  Medications Ordered in ED Medications - No data to display  ED Course/ Medical Decision Making/ A&P   {   Click here for ABCD2, HEART and other calculatorsREFRESH Note before signing :1}                              Medical Decision Making Amount and/or Complexity of Data Reviewed Labs: ordered. Radiology: ordered.   This patient complains of ***; this involves an extensive number of treatment Options and is a complaint that carries with it a high risk of complications and morbidity. The differential includes ***  I ordered, reviewed and interpreted labs, which included *** I ordered medication *** and reviewed PMP when indicated. I ordered imaging studies which included *** and I independently    visualized and interpreted imaging which showed *** Additional history obtained from *** Previous records obtained and reviewed *** I consulted *** and discussed lab and imaging findings and discussed disposition.  Cardiac monitoring reviewed, *** Social determinants considered, *** Critical Interventions: ***  After the interventions stated above, I reevaluated the patient and found *** Admission and further testing considered, ***   {Document critical care time when appropriate:1} {Document review of labs and clinical decision tools ie heart score, Chads2Vasc2 etc:1}  {Document your independent review of radiology images, and any outside records:1} {Document your discussion with  family members, caretakers, and with consultants:1} {Document social determinants of health affecting pt's care:1} {Document your decision making why or why not admission, treatments were needed:1} Final Clinical Impression(s) / ED Diagnoses Final diagnoses:  None    Rx / DC Orders ED Discharge Orders     None

## 2023-11-29 NOTE — ED Triage Notes (Addendum)
 Pt sent by [facility] urgent care d/t feeling like heart racing. Pt had EKG at [facility] but BP 187/110. Pt states his medications (Lisinopril) were adjusted from 40 mg to 20 mg and he ran out of his medication. Pt states he started taking the 40 mg and it starting messing with him so he stopped and was suppose to see PCP today about BP medication but did not make the appointment.. Pt states no chest pain just feels like his heart is racing and shortness of breath.

## 2023-12-07 ENCOUNTER — Encounter: Payer: Self-pay | Admitting: Family Medicine

## 2023-12-07 ENCOUNTER — Ambulatory Visit: Payer: Self-pay | Admitting: Family Medicine

## 2023-12-07 VITALS — BP 182/102 | HR 57 | Temp 97.9°F | Ht 71.0 in | Wt 225.0 lb

## 2023-12-07 DIAGNOSIS — E66811 Obesity, class 1: Secondary | ICD-10-CM | POA: Diagnosis not present

## 2023-12-07 DIAGNOSIS — I10 Essential (primary) hypertension: Secondary | ICD-10-CM | POA: Diagnosis not present

## 2023-12-07 DIAGNOSIS — E785 Hyperlipidemia, unspecified: Secondary | ICD-10-CM

## 2023-12-07 DIAGNOSIS — R7303 Prediabetes: Secondary | ICD-10-CM

## 2023-12-07 LAB — BAYER DCA HB A1C WAIVED: HB A1C (BAYER DCA - WAIVED): 6.1 % — ABNORMAL HIGH (ref 4.8–5.6)

## 2023-12-07 MED ORDER — VALSARTAN-HYDROCHLOROTHIAZIDE 320-25 MG PO TABS: 1.0000 | ORAL_TABLET | Freq: Every day | ORAL | 2 refills | Status: DC

## 2023-12-07 NOTE — Progress Notes (Signed)
 Subjective:  Patient ID: Stephen Schwartz, male    DOB: 04-Nov-1970  Age: 53 y.o. MRN: [id number]  CC: Medical Management of Chronic Issues (Following  since having allergic reaction from lisinopril  and dosage was decreased. Not taking BP at home. )   HPI Stephen Schwartz presents for  follow-up of hypertension. Patient has no history of headache chest pain or shortness of breath or recent cough. Patient also denies symptoms of TIA such as focal numbness or weakness. Patient denies side effects from medication. States taking it regularly.  He is now taking the lisinopril  20 mg a day.  He cut it back from the 40 that had been prescribed at his recent visit.  This is because of an allergic reaction.  He experienced tachycardia palpitations and dyspnea.  He presented to the emergency room for this and it was there opinion at the emergency room that he should decrease the dose and follow-up here.  Patient is also followed for prediabetes.  He is due to have follow-up A1c. History Stephen Schwartz has a past medical history of Hip pain, History of nephrolithiasis, Hypertension, Psoriasis, and Psoriasis.   He has a past surgical history that includes No past surgeries; Colonoscopy with propofol  (N/A, 02/09/2021); polypectomy (02/09/2021); and Open reduction internal fixation (orif) distal radial fracture (Left, 01/13/2022).   His family history includes Colon cancer in his paternal grandfather.He reports that he has never smoked. He has never used smokeless tobacco. He reports that he does not drink alcohol and does not use drugs.  Current Outpatient Medications on File Prior to Visit  Medication Sig Dispense Refill   fluticasone  (FLONASE ) 50 MCG/ACT nasal spray Place 2 sprays into both nostrils daily. (Patient taking differently: Place 2 sprays into both nostrils daily as needed for allergies.) 16 g 6   Pseudoeph-Doxylamine-DM-APAP (NYQUIL PO) Take 15 mLs by mouth at bedtime as needed (sinus issues).      Pseudoephedrine-APAP-DM (DAYQUIL PO) Take 15 mLs by mouth daily as needed (sinus issues).     No current facility-administered medications on file prior to visit.    ROS Review of Systems  Constitutional:  Negative for fever.  Respiratory:  Negative for shortness of breath.   Cardiovascular:  Negative for chest pain.  Musculoskeletal:  Negative for arthralgias.  Skin:  Negative for rash.    Objective:  BP (!) 182/102   Pulse (!) 57   Temp 97.9 F (36.6 C)   Ht 5\' 11"  (1.803 m)   Wt 225 lb (102.1 kg)   SpO2 98%   BMI 31.38 kg/m   BP Readings from Last 3 Encounters:  12/07/23 (!) 182/102  11/29/23 (!) 153/113  06/01/23 (!) 140/97    Wt Readings from Last 3 Encounters:  12/07/23 225 lb (102.1 kg)  11/29/23 218 lb 0.6 oz (98.9 kg)  06/01/23 217 lb 12.8 oz (98.8 kg)     Physical Exam Constitutional:      General: He is not in acute distress.    Appearance: He is well-developed.  HENT:     Head: Normocephalic and atraumatic.     Right Ear: External ear normal.     Left Ear: External ear normal.     Nose: Nose normal.  Eyes:     Conjunctiva/sclera: Conjunctivae normal.     Pupils: Pupils are equal, round, and reactive to light.  Cardiovascular:     Rate and Rhythm: Normal rate and regular rhythm.     Heart sounds: Normal heart sounds. No murmur heard.  Pulmonary:     Effort: Pulmonary effort is normal. No respiratory distress.     Breath sounds: Normal breath sounds. No wheezing or rales.  Abdominal:     Palpations: Abdomen is soft.     Tenderness: There is no abdominal tenderness.  Musculoskeletal:        General: Normal range of motion.     Cervical back: Normal range of motion and neck supple.  Skin:    General: Skin is warm and dry.  Neurological:     Mental Status: He is alert and oriented to person, place, and time.     Deep Tendon Reflexes: Reflexes are normal and symmetric.  Psychiatric:        Behavior: Behavior normal.        Thought Content:  Thought content normal.        Judgment: Judgment normal.       Assessment & Plan:  Prediabetes -     Bayer DCA Hb A1c Waived  Essential hypertension -     Bayer DCA Hb A1c Waived -     CMP14+EGFR  Hyperlipidemia, unspecified hyperlipidemia type -     CMP14+EGFR -     Lipid panel  Obesity (BMI 30.0-34.9) -     Bayer DCA Hb A1c Waived -     CMP14+EGFR -     Lipid panel  Other orders -     Valsartan -hydroCHLOROthiazide ; Take 1 tablet by mouth daily. For Blood Pressure  Dispense: 30 tablet; Refill: 2    Allergies as of 12/07/2023       Reactions   Myrbetriq  [mirabegron ] Other (See Comments)   Shoulder pain        Medication List        Accurate as of December 07, 2023  6:04 PM. If you have any questions, ask your nurse or doctor.          STOP taking these medications    lisinopril  20 MG tablet Commonly known as: ZESTRIL  Stopped by: Stephen Schwartz       TAKE these medications    DAYQUIL PO Take 15 mLs by mouth daily as needed (sinus issues).   fluticasone  50 MCG/ACT nasal spray Commonly known as: FLONASE  Place 2 sprays into both nostrils daily. What changed:  when to take this reasons to take this   NYQUIL PO Take 15 mLs by mouth at bedtime as needed (sinus issues).   valsartan -hydrochlorothiazide  320-25 MG tablet Commonly known as: Diovan  HCT Take 1 tablet by mouth daily. For Blood Pressure Started by: Stephen Schwartz       Avoid decongestants for the time being until we make sure his blood pressure is under good control  Follow-up: Return in about 3 weeks (around 12/28/2023) for hypertension.  Stephen Schwartz, M.D.

## 2023-12-08 LAB — LIPID PANEL
Chol/HDL Ratio: 3.5 ratio (ref 0.0–5.0)
Cholesterol, Total: 116 mg/dL (ref 100–199)
HDL: 33 mg/dL — ABNORMAL LOW (ref >39)
LDL Chol Calc (NIH): 64 mg/dL (ref 0–99)
Triglycerides: 102 mg/dL (ref 0–149)
VLDL Cholesterol Cal: 19 mg/dL (ref 5–40)

## 2023-12-08 LAB — CMP14+EGFR
ALT: 18 IU/L (ref 0–44)
AST: 21 IU/L (ref 0–40)
Albumin: 4.3 g/dL (ref 3.8–4.9)
Alkaline Phosphatase: 74 IU/L (ref 44–121)
BUN/Creatinine Ratio: 9 (ref 9–20)
BUN: 12 mg/dL (ref 6–24)
Bilirubin Total: 1.3 mg/dL — ABNORMAL HIGH (ref 0.0–1.2)
CO2: 24 mmol/L (ref 20–29)
Calcium: 9.1 mg/dL (ref 8.7–10.2)
Chloride: 104 mmol/L (ref 96–106)
Creatinine, Ser: 1.27 mg/dL (ref 0.76–1.27)
Globulin, Total: 2.3 g/dL (ref 1.5–4.5)
Glucose: 105 mg/dL — ABNORMAL HIGH (ref 70–99)
Potassium: 4.3 mmol/L (ref 3.5–5.2)
Sodium: 140 mmol/L (ref 134–144)
Total Protein: 6.6 g/dL (ref 6.0–8.5)
eGFR: 68 mL/min/{1.73_m2} (ref >59)

## 2023-12-11 ENCOUNTER — Encounter: Payer: Self-pay | Admitting: Family Medicine

## 2023-12-11 NOTE — Progress Notes (Signed)
Hello Affan,  Your lab result is normal and/or stable.Some minor variations that are not significant are commonly marked abnormal, but do not represent any medical problem for you.  Best regards, Dontrae Morini, M.D.

## 2024-01-04 ENCOUNTER — Ambulatory Visit: Admitting: Family Medicine

## 2024-01-04 ENCOUNTER — Encounter: Payer: Self-pay | Admitting: Family Medicine

## 2024-01-04 VITALS — BP 133/79 | HR 59 | Temp 98.9°F | Ht 71.0 in | Wt 219.0 lb

## 2024-01-04 DIAGNOSIS — I10 Essential (primary) hypertension: Secondary | ICD-10-CM | POA: Diagnosis not present

## 2024-01-04 NOTE — Progress Notes (Unsigned)
   Subjective:  Patient ID: Stephen Schwartz, male    DOB: August 26, 1970  Age: 53 y.o. MRN: [id number]  CC: Medical Management of Chronic Issues (No concerns at this time. )   HPI Stephen Schwartz presents for  follow-up of hypertension. Patient has no history of headache chest pain or shortness of breath or recent cough. Patient also denies symptoms of TIA such as focal numbness or weakness. Patient denies side effects from medication. States taking it regularly.   History Stephen Schwartz has a past medical history of Hip pain, History of nephrolithiasis, Hypertension, Psoriasis, and Psoriasis.   He has a past surgical history that includes No past surgeries; Colonoscopy with propofol  (N/A, 02/09/2021); polypectomy (02/09/2021); and Open reduction internal fixation (orif) distal radial fracture (Left, 01/13/2022).   His family history includes Colon cancer in his paternal grandfather.He reports that he has never smoked. He has never used smokeless tobacco. He reports that he does not drink alcohol and does not use drugs.  Current Outpatient Medications on File Prior to Visit  Medication Sig Dispense Refill  . fluticasone  (FLONASE ) 50 MCG/ACT nasal spray Place 2 sprays into both nostrils daily. (Patient taking differently: Place 2 sprays into both nostrils daily as needed for allergies.) 16 g 6  . Pseudoeph-Doxylamine-DM-APAP (NYQUIL PO) Take 15 mLs by mouth at bedtime as needed (sinus issues).    . Pseudoephedrine-APAP-DM (DAYQUIL PO) Take 15 mLs by mouth daily as needed (sinus issues).    . valsartan -hydrochlorothiazide  (DIOVAN  HCT) 320-25 MG tablet Take 1 tablet by mouth daily. For Blood Pressure 30 tablet 2   No current facility-administered medications on file prior to visit.    ROS Review of Systems  Objective:  BP 133/79   Pulse (!) 59   Temp 98.9 F (37.2 C)   Ht 5\' 11"  (1.803 m)   Wt 219 lb (99.3 kg)   SpO2 97%   BMI 30.54 kg/m   BP Readings from Last 3 Encounters:  01/04/24 133/79   12/07/23 (!) 182/102  11/29/23 (!) 153/113    Wt Readings from Last 3 Encounters:  01/04/24 219 lb (99.3 kg)  12/07/23 225 lb (102.1 kg)  11/29/23 218 lb 0.6 oz (98.9 kg)     Physical Exam    Assessment & Plan:  There are no diagnoses linked to this encounter.  Allergies as of 01/04/2024       Reactions   Myrbetriq  [mirabegron ] Other (See Comments)   Shoulder pain        Medication List        Accurate as of Jan 04, 2024  4:31 PM. If you have any questions, ask your nurse or doctor.          DAYQUIL PO Take 15 mLs by mouth daily as needed (sinus issues).   fluticasone  50 MCG/ACT nasal spray Commonly known as: FLONASE  Place 2 sprays into both nostrils daily. What changed:  when to take this reasons to take this   NYQUIL PO Take 15 mLs by mouth at bedtime as needed (sinus issues).   valsartan -hydrochlorothiazide  320-25 MG tablet Commonly known as: Diovan  HCT Take 1 tablet by mouth daily. For Blood Pressure         Follow-up: Return in about 5 months (around 06/05/2024) for [facility] physical.  Roise Cleaver, M.D.

## 2024-02-15 ENCOUNTER — Ambulatory Visit: Admitting: Family Medicine

## 2024-02-15 ENCOUNTER — Encounter: Payer: Self-pay | Admitting: Family Medicine

## 2024-02-15 VITALS — BP 128/80 | HR 68 | Temp 98.4°F | Ht 71.0 in | Wt 218.0 lb

## 2024-02-15 DIAGNOSIS — R109 Unspecified abdominal pain: Secondary | ICD-10-CM | POA: Diagnosis not present

## 2024-02-15 LAB — URINALYSIS, ROUTINE W REFLEX MICROSCOPIC
Bilirubin, UA: NEGATIVE
Glucose, UA: NEGATIVE
Ketones, UA: NEGATIVE
Nitrite, UA: NEGATIVE
Specific Gravity, UA: 1.015 (ref 1.005–1.030)
Urobilinogen, Ur: 4 mg/dL — ABNORMAL HIGH (ref 0.2–1.0)
pH, UA: 7.5 (ref 5.0–7.5)

## 2024-02-15 LAB — MICROSCOPIC EXAMINATION
Renal Epithel, UA: NONE SEEN /HPF
Yeast, UA: NONE SEEN

## 2024-02-15 MED ORDER — CIPROFLOXACIN HCL 500 MG PO TABS: 500.0000 mg | ORAL_TABLET | Freq: Two times a day (BID) | ORAL | 0 refills | Status: DC

## 2024-02-15 NOTE — Progress Notes (Signed)
 Subjective:  Patient ID: Stephen Schwartz, male    DOB: [birth year]  Age: 52 y.o. MRN: [id number]  CC: Flank Pain   HPI Stephen Schwartz presents for flank pain and dysuria. No hematuria, nausea or fever. Onset a few days ago.     02/15/2024    3:24 PM 06/01/2023    9:40 AM 06/01/2023    9:28 AM  Depression screen PHQ 2/9  Decreased Interest 0 0 0  Down, Depressed, Hopeless 0 0 0  PHQ - 2 Score 0 0 0  Altered sleeping 0    Tired, decreased energy 0    Change in appetite 0    Feeling bad or failure about yourself  0    Trouble concentrating 0    Moving slowly or fidgety/restless 0    Suicidal thoughts 0    PHQ-9 Score 0    Difficult doing work/chores Not difficult at all      History Stephen Schwartz has a past medical history of Hip pain, History of nephrolithiasis, Hypertension, Psoriasis, and Psoriasis.   He has a past surgical history that includes No past surgeries; Colonoscopy with propofol  (N/A, 02/09/2021); polypectomy (02/09/2021); and Open reduction internal fixation (orif) distal radial fracture (Left, 01/13/2022).   His family history includes Colon cancer in his paternal grandfather.He reports that he has never smoked. He has never used smokeless tobacco. He reports that he does not drink alcohol and does not use drugs.    ROS Review of Systems  Constitutional:  Negative for fever.  Genitourinary:  Positive for flank pain and frequency.    Objective:  BP 128/80   Pulse 68   Temp 98.4 F (36.9 C)   Ht 5' 11 (1.803 m)   Wt 218 lb (98.9 kg)   SpO2 97%   BMI 30.40 kg/m   BP Readings from Last 3 Encounters:  02/15/24 128/80  01/04/24 133/79  12/07/23 (!) 182/102    Wt Readings from Last 3 Encounters:  02/15/24 218 lb (98.9 kg)  01/04/24 219 lb (99.3 kg)  12/07/23 225 lb (102.1 kg)     Physical Exam Vitals reviewed.  Constitutional:      Appearance: He is well-developed.  HENT:     Head: Normocephalic and atraumatic.     Right Ear: External ear normal.      Left Ear: External ear normal.     Mouth/Throat:     Pharynx: No oropharyngeal exudate or posterior oropharyngeal erythema.  Eyes:     Pupils: Pupils are equal, round, and reactive to light.  Cardiovascular:     Rate and Rhythm: Normal rate and regular rhythm.     Heart sounds: No murmur heard. Pulmonary:     Effort: No respiratory distress.     Breath sounds: Normal breath sounds.  Musculoskeletal:     Cervical back: Normal range of motion and neck supple.  Neurological:     Mental Status: He is alert and oriented to person, place, and time.     Results for orders placed or performed in visit on 02/15/24  Microscopic Examination   Collection Time: 02/15/24  3:30 PM   Urine  Result Value Ref Range   WBC, UA 11-30 (A) 0 - 5 /hpf   RBC, Urine 0-2 0 - 2 /hpf   Epithelial Cells (non renal) 0-10 0 - 10 /hpf   Renal Epithel, UA None seen None seen /hpf   Bacteria, UA Moderate (A) None seen/Few   Yeast, UA None seen None seen  Urinalysis, Routine w reflex microscopic   Collection Time: 02/15/24  3:30 PM  Result Value Ref Range   Specific Gravity, UA 1.015 1.005 - 1.030   pH, UA 7.5 5.0 - 7.5   Color, UA Yellow Yellow   Appearance Ur Clear Clear   Leukocytes,UA 1+ (A) Negative   Protein,UA 1+ (A) Negative/Trace   Glucose, UA Negative Negative   Ketones, UA Negative Negative   RBC, UA Trace (A) Negative   Bilirubin, UA Negative Negative   Urobilinogen, Ur 4.0 (H) 0.2 - 1.0 mg/dL   Nitrite, UA Negative Negative   Microscopic Examination See below:   Urine Culture   Collection Time: 02/15/24  3:34 PM   Specimen: Urine   UR  Result Value Ref Range   Urine Culture, Routine Final report (A)    Organism ID, Bacteria Escherichia coli (A)    Antimicrobial Susceptibility Comment     Assessment & Plan:  Acute flank pain -     Urine Culture -     Urinalysis, Routine w reflex microscopic -     Ciprofloxacin  HCl; Take 1 tablet (500 mg total) by mouth 2 (two) times daily. For  prostate. Take all of these.  Dispense: 30 tablet; Refill: 0 -     Microscopic Examination     Follow-up: Return if symptoms worsen or fail to improve.  Butler Der, M.D.

## 2024-02-19 LAB — URINE CULTURE

## 2024-02-23 ENCOUNTER — Ambulatory Visit: Payer: Self-pay | Admitting: Family Medicine

## 2024-03-08 ENCOUNTER — Ambulatory Visit: Admitting: Family Medicine

## 2024-03-21 ENCOUNTER — Ambulatory Visit: Admitting: Family Medicine

## 2024-03-21 ENCOUNTER — Ambulatory Visit: Payer: Self-pay | Admitting: Family Medicine

## 2024-03-21 VITALS — BP 134/90 | HR 69 | Temp 97.0°F | Ht 71.0 in | Wt 217.2 lb

## 2024-03-21 DIAGNOSIS — N12 Tubulo-interstitial nephritis, not specified as acute or chronic: Secondary | ICD-10-CM

## 2024-03-21 LAB — URINALYSIS, COMPLETE
Bilirubin, UA: NEGATIVE
Glucose, UA: NEGATIVE
Ketones, UA: NEGATIVE
Leukocytes,UA: NEGATIVE
Nitrite, UA: NEGATIVE
Protein,UA: NEGATIVE
Specific Gravity, UA: 1.02 (ref 1.005–1.030)
Urobilinogen, Ur: 0.2 mg/dL (ref 0.2–1.0)
pH, UA: 5.5 (ref 5.0–7.5)

## 2024-03-21 LAB — MICROSCOPIC EXAMINATION
Bacteria, UA: NONE SEEN
Epithelial Cells (non renal): NONE SEEN /HPF (ref 0–10)
Renal Epithel, UA: NONE SEEN /HPF
WBC, UA: NONE SEEN /HPF (ref 0–5)
Yeast, UA: NONE SEEN

## 2024-03-21 NOTE — Progress Notes (Signed)
 Subjective:  Patient ID: Stephen Schwartz, male    DOB: [birth year]  Age: 53 y.o. MRN: [id number]  CC: Follow-up   HPI Stephen Schwartz presents for follow-up of his recent pyelonephritis.  He does have an occasional twinge of pain in the left flank still , but this is dramatically improved since he was treated last month.   follow-up of hypertension. Patient has no history of headache chest pain or shortness of breath or recent cough. Patient also denies symptoms of TIA such as numbness weakness lateralizing. Patient checks  blood pressure at home and has not had any elevated readings recently. Patient denies side effects from his medication. States taking it regularly.      03/21/2024    2:58 PM 02/15/2024    3:24 PM 06/01/2023    9:40 AM  Depression screen PHQ 2/9  Decreased Interest 0 0 0  Down, Depressed, Hopeless 0 0 0  PHQ - 2 Score 0 0 0  Altered sleeping  0   Tired, decreased energy  0   Change in appetite  0   Feeling bad or failure about yourself   0   Trouble concentrating  0   Moving slowly or fidgety/restless  0   Suicidal thoughts  0   PHQ-9 Score  0   Difficult doing work/chores  Not difficult at all     History Stephen Schwartz has a past medical history of Hip pain, History of nephrolithiasis, Hypertension, Psoriasis, and Psoriasis.   He has a past surgical history that includes No past surgeries; Colonoscopy with propofol  (N/A, 02/09/2021); polypectomy (02/09/2021); and Open reduction internal fixation (orif) distal radial fracture (Left, 01/13/2022).   His family history includes Colon cancer in his paternal grandfather.He reports that he has never smoked. He has never used smokeless tobacco. He reports that he does not drink alcohol and does not use drugs.    ROS Review of Systems  Constitutional:  Negative for fever.  Respiratory:  Negative for shortness of breath.   Cardiovascular:  Negative for chest pain.  Musculoskeletal:  Negative for arthralgias.  Skin:  Negative  for rash.    Objective:  BP (!) 134/90   Pulse 69   Temp (!) 97 F (36.1 C)   Ht 5' 11 (1.803 m)   Wt 217 lb 3.2 oz (98.5 kg)   SpO2 98%   BMI 30.29 kg/m   BP Readings from Last 3 Encounters:  03/21/24 (!) 134/90  02/15/24 128/80  01/04/24 133/79    Wt Readings from Last 3 Encounters:  03/21/24 217 lb 3.2 oz (98.5 kg)  02/15/24 218 lb (98.9 kg)  01/04/24 219 lb (99.3 kg)     Physical Exam Vitals reviewed.  Constitutional:      Appearance: He is well-developed.  HENT:     Head: Normocephalic and atraumatic.     Right Ear: External ear normal.     Left Ear: External ear normal.     Mouth/Throat:     Pharynx: No oropharyngeal exudate or posterior oropharyngeal erythema.  Eyes:     Pupils: Pupils are equal, round, and reactive to light.  Cardiovascular:     Rate and Rhythm: Normal rate and regular rhythm.     Heart sounds: No murmur heard. Pulmonary:     Effort: No respiratory distress.     Breath sounds: Normal breath sounds.  Musculoskeletal:     Cervical back: Normal range of motion and neck supple.  Neurological:     Mental Status:  He is alert and oriented to person, place, and time.      Assessment & Plan:  Pyelonephritis -     Urine Culture -     Urinalysis, Complete  Other orders -     Microscopic Examination   Symptoms resolved for pyelonephritis follow-up as previously arranged for complete physical in November.    Butler Der, M.D.

## 2024-03-23 LAB — URINE CULTURE

## 2024-03-26 NOTE — Progress Notes (Signed)
 Hello Stephen Schwartz,  Your lab result is normal and/or stable.Some minor variations that are not significant are commonly marked abnormal, but do not represent any medical problem for you.  Best regards, Butler Der, M.D.

## 2024-06-18 ENCOUNTER — Encounter: Payer: Self-pay | Admitting: Family Medicine

## 2024-06-18 ENCOUNTER — Ambulatory Visit: Admitting: Family Medicine

## 2024-06-18 ENCOUNTER — Ambulatory Visit: Payer: Self-pay | Admitting: Family Medicine

## 2024-06-18 VITALS — BP 154/92 | HR 57 | Temp 98.0°F | Ht 71.0 in | Wt 214.0 lb

## 2024-06-18 DIAGNOSIS — R7303 Prediabetes: Secondary | ICD-10-CM

## 2024-06-18 DIAGNOSIS — E66811 Obesity, class 1: Secondary | ICD-10-CM | POA: Diagnosis not present

## 2024-06-18 DIAGNOSIS — E785 Hyperlipidemia, unspecified: Secondary | ICD-10-CM | POA: Diagnosis not present

## 2024-06-18 DIAGNOSIS — Z125 Encounter for screening for malignant neoplasm of prostate: Secondary | ICD-10-CM

## 2024-06-18 DIAGNOSIS — R972 Elevated prostate specific antigen [PSA]: Secondary | ICD-10-CM

## 2024-06-18 DIAGNOSIS — I10 Essential (primary) hypertension: Secondary | ICD-10-CM | POA: Diagnosis not present

## 2024-06-18 LAB — LIPID PANEL

## 2024-06-18 LAB — BAYER DCA HB A1C WAIVED: HB A1C (BAYER DCA - WAIVED): 5.9 % — ABNORMAL HIGH (ref 4.8–5.6)

## 2024-06-18 MED ORDER — VALSARTAN-HYDROCHLOROTHIAZIDE 320-25 MG PO TABS: 1.0000 | ORAL_TABLET | Freq: Every day | ORAL | 1 refills | Status: AC

## 2024-06-18 NOTE — Progress Notes (Signed)
 Subjective:  Patient ID: Stephen Schwartz, male    DOB: [birth year]  Age: 53 y.o. MRN: [id number]  CC: Annual Exam   HPI  Discussed the use of AI scribe software for clinical note transcription with the patient, who gave verbal consent to proceed.  History of Present Illness Stephen Schwartz is a 53 year old male with prediabetes who presents for a diabetes checkup.  He IS followed for prediabetes. He has not been monitoring his blood sugar at home and does not have a glucose monitor. His A1c test today confirmed prediabetes.  He has a history of hypertension and is currently taking valsartan  and hydrochlorothiazide . He takes his medication every other day due to travel, which affects his ability to take it consistently. He did not take his blood pressure medication this morning and has only two or three pills left at home.  He has been trying to lose weight and has lost about three pounds. He exercises five to six times a week but finds it challenging to lose weight. He is considering dietary changes, including increasing protein intake and reducing carbohydrates. He is concerned about the cost of lean protein sources.  No family history of heart attack or stroke. He does not smoke.          03/21/2024    2:58 PM 02/15/2024    3:24 PM 06/01/2023    9:40 AM  Depression screen PHQ 2/9  Decreased Interest 0 0 0  Down, Depressed, Hopeless 0 0 0  PHQ - 2 Score 0 0 0  Altered sleeping  0   Tired, decreased energy  0   Change in appetite  0   Feeling bad or failure about yourself   0   Trouble concentrating  0   Moving slowly or fidgety/restless  0   Suicidal thoughts  0   PHQ-9 Score  0   Difficult doing work/chores  Not difficult at all     History Stephen Schwartz has a past medical history of Hip pain, History of nephrolithiasis, Hypertension, Psoriasis, and Psoriasis.   He has a past surgical history that includes No past surgeries; Colonoscopy with propofol  (N/A, 02/09/2021); polypectomy  (02/09/2021); and Open reduction internal fixation (orif) distal radial fracture (Left, 01/13/2022).   His family history includes Colon cancer in his paternal grandfather; Prostate cancer in his father.He reports that he has never smoked. He has never used smokeless tobacco. He reports that he does not drink alcohol and does not use drugs.    ROS Review of Systems  Constitutional: Negative.   HENT: Negative.    Eyes:  Negative for visual disturbance.  Respiratory:  Negative for cough and shortness of breath.   Cardiovascular:  Negative for chest pain and leg swelling.  Gastrointestinal:  Negative for abdominal pain, diarrhea, nausea and vomiting.  Genitourinary:  Negative for difficulty urinating.  Musculoskeletal:  Negative for arthralgias and myalgias.  Skin:  Negative for rash.  Neurological:  Negative for headaches.  Psychiatric/Behavioral:  Negative for sleep disturbance.     Objective:  BP (!) 154/92   Pulse (!) 57   Temp 98 F (36.7 C)   Ht 5' 11 (1.803 m)   Wt 214 lb (97.1 kg)   SpO2 95%   BMI 29.85 kg/m   BP Readings from Last 3 Encounters:  06/18/24 (!) 154/92  03/21/24 (!) 134/90  02/15/24 128/80    Wt Readings from Last 3 Encounters:  06/18/24 214 lb (97.1 kg)  03/21/24 217 lb  3.2 oz (98.5 kg)  02/15/24 218 lb (98.9 kg)     Physical Exam Vitals reviewed.  Constitutional:      Appearance: He is well-developed.  HENT:     Head: Normocephalic and atraumatic.     Right Ear: External ear normal.     Left Ear: External ear normal.     Mouth/Throat:     Pharynx: No oropharyngeal exudate or posterior oropharyngeal erythema.  Eyes:     Pupils: Pupils are equal, round, and reactive to light.  Cardiovascular:     Rate and Rhythm: Normal rate and regular rhythm.     Heart sounds: No murmur heard. Pulmonary:     Effort: No respiratory distress.     Breath sounds: Normal breath sounds.  Musculoskeletal:     Cervical back: Normal range of motion and neck  supple.  Neurological:     Mental Status: He is alert and oriented to person, place, and time.    Physical Exam VITALS: BP- 140/90 GENERAL: Alert, cooperative, well developed, no acute distress. HEENT: Normocephalic, normal oropharynx, moist mucous membranes. CHEST: Clear to auscultation bilaterally, no wheezes, rhonchi, or crackles. CARDIOVASCULAR: Normal heart rate and rhythm, S1 and S2 normal without murmurs. ABDOMEN: Soft, non-tender, non-distended, without organomegaly, normal bowel sounds. EXTREMITIES: No cyanosis or edema. NEUROLOGICAL: Cranial nerves grossly intact, moves all extremities without gross motor or sensory deficit.   Assessment & Plan:  Prediabetes -     Bayer DCA Hb A1c Waived  Essential hypertension -     CBC with Differential/Platelet  Obesity (BMI 30.0-34.9) -     Bayer DCA Hb A1c Waived -     CBC with Differential/Platelet -     Comprehensive metabolic panel with GFR -     Lipid panel  Hyperlipidemia, unspecified hyperlipidemia type -     Comprehensive metabolic panel with GFR -     Lipid panel  Prostate cancer screening -     PSA, total and free  Other orders -     Valsartan -hydroCHLOROthiazide ; Take 1 tablet by mouth daily. For Blood Pressure  Dispense: 90 tablet; Refill: 1    Assessment and Plan Assessment & Plan Essential hypertension   Blood pressure readings are above target due to inconsistent medication adherence, as he has been taking valsartan  and hydrochlorothiazide  every other day while traveling. Refill valsartan  and hydrochlorothiazide  prescription. Instruct to take blood pressure medication daily. Monitor blood pressure regularly.  Obesity, class 1   He is approximately 20-25 pounds above target weight, which is important to address for blood pressure improvement and diabetes risk reduction. Set a weight loss goal of 20 pounds. Advise on dietary changes emphasizing lean protein and reduced carbohydrate intake. Encourage regular  physical activity.  Prediabetes   Recent A1c test confirms prediabetes, with a risk of progression to type 2 diabetes, especially given current weight status. Advise on dietary changes focusing on increased protein and reduced carbohydrates. Encourage regular exercise, aiming for 5-6 times a week.       Follow-up: Return in about 6 months (around 12/16/2024) for [facility] physical.  Butler Der, M.D.

## 2024-06-19 LAB — CBC WITH DIFFERENTIAL/PLATELET
Basophils Absolute: 0.1 x10E3/uL (ref 0.0–0.2)
Basos: 1 %
EOS (ABSOLUTE): 0.1 x10E3/uL (ref 0.0–0.4)
Eos: 2 %
Hematocrit: 46.7 % (ref 37.5–51.0)
Hemoglobin: 14.6 g/dL (ref 13.0–17.7)
Immature Grans (Abs): 0 x10E3/uL (ref 0.0–0.1)
Immature Granulocytes: 0 %
Lymphocytes Absolute: 1.9 x10E3/uL (ref 0.7–3.1)
Lymphs: 41 %
MCH: 26 pg — ABNORMAL LOW (ref 26.6–33.0)
MCHC: 31.3 g/dL — ABNORMAL LOW (ref 31.5–35.7)
MCV: 83 fL (ref 79–97)
Monocytes Absolute: 0.4 x10E3/uL (ref 0.1–0.9)
Monocytes: 8 %
Neutrophils Absolute: 2.2 x10E3/uL (ref 1.4–7.0)
Neutrophils: 48 %
Platelets: 272 x10E3/uL (ref 150–450)
RBC: 5.62 x10E6/uL (ref 4.14–5.80)
RDW: 14.4 % (ref 11.6–15.4)
WBC: 4.6 x10E3/uL (ref 3.4–10.8)

## 2024-06-19 LAB — LIPID PANEL
Cholesterol, Total: 109 mg/dL (ref 100–199)
HDL: 36 mg/dL — AB (ref >39)
LDL CALC COMMENT:: 3 ratio (ref 0.0–5.0)
LDL Chol Calc (NIH): 58 mg/dL (ref 0–99)
Triglycerides: 69 mg/dL (ref 0–149)
VLDL Cholesterol Cal: 15 mg/dL (ref 5–40)

## 2024-06-19 LAB — COMPREHENSIVE METABOLIC PANEL WITH GFR
ALT: 20 IU/L (ref 0–44)
AST: 24 IU/L (ref 0–40)
Albumin: 4.6 g/dL (ref 3.8–4.9)
Alkaline Phosphatase: 70 IU/L (ref 47–123)
BUN/Creatinine Ratio: 7 — AB (ref 9–20)
BUN: 10 mg/dL (ref 6–24)
Bilirubin Total: 1.7 mg/dL — AB (ref 0.0–1.2)
CO2: 22 mmol/L (ref 20–29)
Calcium: 9.1 mg/dL (ref 8.7–10.2)
Chloride: 105 mmol/L (ref 96–106)
Creatinine, Ser: 1.37 mg/dL — AB (ref 0.76–1.27)
Globulin, Total: 2.6 g/dL (ref 1.5–4.5)
Glucose: 89 mg/dL (ref 70–99)
Potassium: 4.2 mmol/L (ref 3.5–5.2)
Sodium: 141 mmol/L (ref 134–144)
Total Protein: 7.2 g/dL (ref 6.0–8.5)
eGFR: 62 mL/min/1.73 (ref >59)

## 2024-06-19 LAB — PSA, TOTAL AND FREE
PSA, Free Pct: 6.5 %
PSA, Free: 0.28 ng/mL
Prostate Specific Ag, Serum: 4.3 ng/mL — AB (ref 0.0–4.0)

## 2024-06-20 NOTE — Telephone Encounter (Signed)
 Reviewed results with patient and he voiced understanding. He made a lab appt for 09/20/2024 to recheck PSA. Future order has been placed.

## 2024-09-20 ENCOUNTER — Other Ambulatory Visit

## 2024-12-17 ENCOUNTER — Ambulatory Visit: Admitting: Family Medicine

## 2025-06-19 ENCOUNTER — Encounter: Admitting: Family Medicine
